# Patient Record
Sex: Female | Born: 1955 | Race: White | Hispanic: No | Marital: Married | Smoking: Former smoker
Health system: Southern US, Community
[De-identification: ages and names within clinical notes are randomized; demographics above are authoritative.]

## PROBLEM LIST (undated history)

## (undated) DIAGNOSIS — M1990 Unspecified osteoarthritis, unspecified site: Secondary | ICD-10-CM

## (undated) DIAGNOSIS — F32A Depression, unspecified: Secondary | ICD-10-CM

## (undated) DIAGNOSIS — R011 Cardiac murmur, unspecified: Secondary | ICD-10-CM

## (undated) DIAGNOSIS — C801 Malignant (primary) neoplasm, unspecified: Secondary | ICD-10-CM

## (undated) DIAGNOSIS — S22089A Unspecified fracture of T11-T12 vertebra, initial encounter for closed fracture: Secondary | ICD-10-CM

## (undated) DIAGNOSIS — G20 Parkinson's disease: Secondary | ICD-10-CM

## (undated) DIAGNOSIS — S32019A Unspecified fracture of first lumbar vertebra, initial encounter for closed fracture: Secondary | ICD-10-CM

## (undated) DIAGNOSIS — N390 Urinary tract infection, site not specified: Secondary | ICD-10-CM

## (undated) DIAGNOSIS — Z9889 Other specified postprocedural states: Secondary | ICD-10-CM

## (undated) DIAGNOSIS — F329 Major depressive disorder, single episode, unspecified: Secondary | ICD-10-CM

## (undated) DIAGNOSIS — K759 Inflammatory liver disease, unspecified: Secondary | ICD-10-CM

## (undated) DIAGNOSIS — E785 Hyperlipidemia, unspecified: Secondary | ICD-10-CM

## (undated) DIAGNOSIS — R251 Tremor, unspecified: Secondary | ICD-10-CM

## (undated) HISTORY — PX: CHOLECYSTECTOMY: SHX55

## (undated) HISTORY — DX: Malignant (primary) neoplasm, unspecified: C80.1

## (undated) HISTORY — DX: Parkinson's disease: G20

## (undated) HISTORY — DX: Major depressive disorder, single episode, unspecified: F32.9

## (undated) HISTORY — PX: TUBAL LIGATION: SHX77

## (undated) HISTORY — PX: ABDOMINAL HYSTERECTOMY: SHX81

## (undated) HISTORY — DX: Cardiac murmur, unspecified: R01.1

## (undated) HISTORY — PX: CERVICAL SPINE SURGERY: SHX589

## (undated) HISTORY — DX: Depression, unspecified: F32.A

---

## 1993-10-29 DIAGNOSIS — C519 Malignant neoplasm of vulva, unspecified: Secondary | ICD-10-CM

## 1993-10-29 HISTORY — DX: Malignant neoplasm of vulva, unspecified: C51.9

## 2004-08-24 ENCOUNTER — Ambulatory Visit: Payer: Self-pay | Admitting: Pain Medicine

## 2004-09-26 ENCOUNTER — Ambulatory Visit: Payer: Self-pay | Admitting: Pain Medicine

## 2004-10-17 ENCOUNTER — Ambulatory Visit: Payer: Self-pay | Admitting: Pain Medicine

## 2004-10-26 ENCOUNTER — Ambulatory Visit: Payer: Self-pay | Admitting: Unknown Physician Specialty

## 2004-11-16 ENCOUNTER — Ambulatory Visit: Payer: Self-pay | Admitting: Pain Medicine

## 2004-12-19 ENCOUNTER — Ambulatory Visit: Payer: Self-pay | Admitting: Pain Medicine

## 2005-01-23 ENCOUNTER — Ambulatory Visit: Payer: Self-pay | Admitting: Pain Medicine

## 2005-02-22 ENCOUNTER — Ambulatory Visit: Payer: Self-pay | Admitting: Pain Medicine

## 2005-03-22 ENCOUNTER — Ambulatory Visit: Payer: Self-pay | Admitting: Pain Medicine

## 2005-04-19 ENCOUNTER — Ambulatory Visit: Payer: Self-pay | Admitting: Pain Medicine

## 2005-05-22 ENCOUNTER — Ambulatory Visit: Payer: Self-pay | Admitting: Pain Medicine

## 2005-06-19 ENCOUNTER — Ambulatory Visit: Payer: Self-pay | Admitting: Pain Medicine

## 2005-07-19 ENCOUNTER — Ambulatory Visit: Payer: Self-pay | Admitting: Pain Medicine

## 2005-08-16 ENCOUNTER — Ambulatory Visit: Payer: Self-pay | Admitting: Pain Medicine

## 2005-09-12 ENCOUNTER — Ambulatory Visit: Payer: Self-pay | Admitting: Pain Medicine

## 2005-10-05 ENCOUNTER — Ambulatory Visit: Payer: Self-pay | Admitting: Unknown Physician Specialty

## 2005-10-16 ENCOUNTER — Ambulatory Visit: Payer: Self-pay | Admitting: Pain Medicine

## 2005-11-13 ENCOUNTER — Ambulatory Visit: Payer: Self-pay | Admitting: Pain Medicine

## 2005-12-13 ENCOUNTER — Ambulatory Visit: Payer: Self-pay | Admitting: Pain Medicine

## 2006-01-03 ENCOUNTER — Ambulatory Visit: Payer: Self-pay | Admitting: Pain Medicine

## 2006-01-29 ENCOUNTER — Ambulatory Visit: Payer: Self-pay | Admitting: Pain Medicine

## 2006-02-28 ENCOUNTER — Ambulatory Visit: Payer: Self-pay | Admitting: Pain Medicine

## 2006-04-04 ENCOUNTER — Ambulatory Visit: Payer: Self-pay | Admitting: Pain Medicine

## 2006-05-14 ENCOUNTER — Ambulatory Visit: Payer: Self-pay | Admitting: Pain Medicine

## 2006-06-11 ENCOUNTER — Ambulatory Visit: Payer: Self-pay | Admitting: Pain Medicine

## 2006-07-11 ENCOUNTER — Ambulatory Visit: Payer: Self-pay | Admitting: Pain Medicine

## 2006-08-13 ENCOUNTER — Ambulatory Visit: Payer: Self-pay | Admitting: Pain Medicine

## 2006-08-29 ENCOUNTER — Ambulatory Visit: Payer: Self-pay | Admitting: Pain Medicine

## 2006-10-10 ENCOUNTER — Ambulatory Visit: Payer: Self-pay | Admitting: Pain Medicine

## 2006-11-12 ENCOUNTER — Ambulatory Visit: Payer: Self-pay | Admitting: Pain Medicine

## 2006-12-10 ENCOUNTER — Ambulatory Visit: Payer: Self-pay | Admitting: Pain Medicine

## 2007-01-08 ENCOUNTER — Ambulatory Visit: Payer: Self-pay | Admitting: Pain Medicine

## 2007-02-06 ENCOUNTER — Ambulatory Visit: Payer: Self-pay | Admitting: Pain Medicine

## 2007-03-11 ENCOUNTER — Ambulatory Visit: Payer: Self-pay | Admitting: Pain Medicine

## 2007-04-08 ENCOUNTER — Ambulatory Visit: Payer: Self-pay | Admitting: Pain Medicine

## 2007-05-06 ENCOUNTER — Ambulatory Visit: Payer: Self-pay | Admitting: Pain Medicine

## 2007-06-05 ENCOUNTER — Ambulatory Visit: Payer: Self-pay | Admitting: Pain Medicine

## 2007-06-23 ENCOUNTER — Ambulatory Visit: Payer: Self-pay | Admitting: Pain Medicine

## 2007-08-08 ENCOUNTER — Ambulatory Visit: Payer: Self-pay | Admitting: Pain Medicine

## 2007-09-09 ENCOUNTER — Ambulatory Visit: Payer: Self-pay | Admitting: Pain Medicine

## 2007-10-07 ENCOUNTER — Ambulatory Visit: Payer: Self-pay | Admitting: Pain Medicine

## 2007-11-10 ENCOUNTER — Ambulatory Visit: Payer: Self-pay | Admitting: Pain Medicine

## 2007-12-03 ENCOUNTER — Ambulatory Visit: Payer: Self-pay | Admitting: Pain Medicine

## 2008-01-06 ENCOUNTER — Ambulatory Visit: Payer: Self-pay | Admitting: Pain Medicine

## 2008-02-03 ENCOUNTER — Ambulatory Visit: Payer: Self-pay | Admitting: Pain Medicine

## 2008-03-09 ENCOUNTER — Ambulatory Visit: Payer: Self-pay | Admitting: Pain Medicine

## 2008-04-08 ENCOUNTER — Ambulatory Visit: Payer: Self-pay | Admitting: Pain Medicine

## 2008-05-06 ENCOUNTER — Ambulatory Visit: Payer: Self-pay | Admitting: Pain Medicine

## 2008-06-08 ENCOUNTER — Ambulatory Visit: Payer: Self-pay | Admitting: Pain Medicine

## 2008-07-08 ENCOUNTER — Ambulatory Visit: Payer: Self-pay | Admitting: Pain Medicine

## 2008-08-10 ENCOUNTER — Ambulatory Visit: Payer: Self-pay | Admitting: Pain Medicine

## 2008-09-07 ENCOUNTER — Ambulatory Visit: Payer: Self-pay | Admitting: Pain Medicine

## 2008-10-07 ENCOUNTER — Ambulatory Visit: Payer: Self-pay | Admitting: Pain Medicine

## 2008-11-04 ENCOUNTER — Ambulatory Visit: Payer: Self-pay | Admitting: Pain Medicine

## 2008-12-02 ENCOUNTER — Ambulatory Visit: Payer: Self-pay | Admitting: Pain Medicine

## 2008-12-29 ENCOUNTER — Ambulatory Visit: Payer: Self-pay | Admitting: Pain Medicine

## 2009-01-27 ENCOUNTER — Ambulatory Visit: Payer: Self-pay | Admitting: Pain Medicine

## 2009-03-01 ENCOUNTER — Ambulatory Visit: Payer: Self-pay | Admitting: Pain Medicine

## 2009-03-31 ENCOUNTER — Ambulatory Visit: Payer: Self-pay | Admitting: Pain Medicine

## 2009-04-28 ENCOUNTER — Ambulatory Visit: Payer: Self-pay | Admitting: Pain Medicine

## 2009-05-09 ENCOUNTER — Emergency Department: Payer: Self-pay | Admitting: Emergency Medicine

## 2009-05-11 ENCOUNTER — Emergency Department: Payer: Self-pay | Admitting: Emergency Medicine

## 2009-05-31 ENCOUNTER — Ambulatory Visit: Payer: Self-pay | Admitting: Pain Medicine

## 2009-06-30 ENCOUNTER — Ambulatory Visit: Payer: Self-pay | Admitting: Pain Medicine

## 2009-07-28 ENCOUNTER — Ambulatory Visit: Payer: Self-pay | Admitting: Pain Medicine

## 2009-08-30 ENCOUNTER — Ambulatory Visit: Payer: Self-pay | Admitting: Pain Medicine

## 2009-09-29 ENCOUNTER — Ambulatory Visit: Payer: Self-pay | Admitting: Pain Medicine

## 2009-10-25 ENCOUNTER — Ambulatory Visit: Payer: Self-pay | Admitting: Pain Medicine

## 2009-11-24 ENCOUNTER — Ambulatory Visit: Payer: Self-pay | Admitting: Pain Medicine

## 2009-12-27 ENCOUNTER — Ambulatory Visit: Payer: Self-pay | Admitting: Pain Medicine

## 2010-01-24 ENCOUNTER — Ambulatory Visit: Payer: Self-pay | Admitting: Pain Medicine

## 2010-02-21 ENCOUNTER — Ambulatory Visit: Payer: Self-pay | Admitting: Pain Medicine

## 2010-03-23 ENCOUNTER — Ambulatory Visit: Payer: Self-pay | Admitting: Pain Medicine

## 2010-04-25 ENCOUNTER — Ambulatory Visit: Payer: Self-pay | Admitting: Pain Medicine

## 2010-05-25 ENCOUNTER — Ambulatory Visit: Payer: Self-pay | Admitting: Pain Medicine

## 2010-06-22 ENCOUNTER — Ambulatory Visit: Payer: Self-pay | Admitting: Pain Medicine

## 2010-07-27 ENCOUNTER — Ambulatory Visit: Payer: Self-pay | Admitting: Pain Medicine

## 2010-08-24 ENCOUNTER — Ambulatory Visit: Payer: Self-pay | Admitting: Pain Medicine

## 2010-09-26 ENCOUNTER — Ambulatory Visit: Payer: Self-pay | Admitting: Pain Medicine

## 2010-10-26 ENCOUNTER — Ambulatory Visit: Payer: Self-pay | Admitting: Pain Medicine

## 2010-11-23 ENCOUNTER — Ambulatory Visit: Payer: Self-pay | Admitting: Pain Medicine

## 2010-12-21 ENCOUNTER — Ambulatory Visit: Payer: Self-pay | Admitting: Pain Medicine

## 2011-01-23 ENCOUNTER — Ambulatory Visit: Payer: Self-pay | Admitting: Pain Medicine

## 2011-02-22 ENCOUNTER — Ambulatory Visit: Payer: Self-pay | Admitting: Pain Medicine

## 2011-03-22 ENCOUNTER — Ambulatory Visit: Payer: Self-pay | Admitting: Pain Medicine

## 2011-04-19 ENCOUNTER — Ambulatory Visit: Payer: Self-pay | Admitting: Pain Medicine

## 2011-05-22 ENCOUNTER — Ambulatory Visit: Payer: Self-pay | Admitting: Pain Medicine

## 2011-06-21 ENCOUNTER — Ambulatory Visit: Payer: Self-pay | Admitting: Pain Medicine

## 2011-07-24 ENCOUNTER — Ambulatory Visit: Payer: Self-pay | Admitting: Pain Medicine

## 2011-08-23 ENCOUNTER — Ambulatory Visit: Payer: Self-pay | Admitting: Pain Medicine

## 2011-09-18 ENCOUNTER — Ambulatory Visit: Payer: Self-pay | Admitting: Pain Medicine

## 2011-10-11 ENCOUNTER — Ambulatory Visit: Payer: Self-pay | Admitting: Pain Medicine

## 2011-11-20 ENCOUNTER — Ambulatory Visit: Payer: Self-pay | Admitting: Pain Medicine

## 2011-12-17 ENCOUNTER — Ambulatory Visit: Payer: Self-pay | Admitting: Pain Medicine

## 2012-01-16 ENCOUNTER — Ambulatory Visit: Payer: Self-pay | Admitting: Pain Medicine

## 2012-02-13 ENCOUNTER — Ambulatory Visit: Payer: Self-pay | Admitting: Pain Medicine

## 2012-03-19 ENCOUNTER — Ambulatory Visit: Payer: Self-pay | Admitting: Pain Medicine

## 2012-04-16 ENCOUNTER — Ambulatory Visit: Payer: Self-pay | Admitting: Pain Medicine

## 2012-05-14 ENCOUNTER — Ambulatory Visit: Payer: Self-pay | Admitting: Pain Medicine

## 2012-06-12 ENCOUNTER — Ambulatory Visit: Payer: Self-pay | Admitting: Pain Medicine

## 2012-07-09 ENCOUNTER — Ambulatory Visit: Payer: Self-pay | Admitting: Pain Medicine

## 2012-08-13 ENCOUNTER — Ambulatory Visit: Payer: Self-pay | Admitting: Pain Medicine

## 2012-09-10 ENCOUNTER — Ambulatory Visit: Payer: Self-pay | Admitting: Pain Medicine

## 2012-10-08 ENCOUNTER — Ambulatory Visit: Payer: Self-pay | Admitting: Pain Medicine

## 2012-10-13 ENCOUNTER — Ambulatory Visit: Payer: Self-pay | Admitting: Pain Medicine

## 2012-11-10 ENCOUNTER — Ambulatory Visit: Payer: Self-pay | Admitting: Pain Medicine

## 2012-12-09 ENCOUNTER — Ambulatory Visit: Payer: Self-pay | Admitting: Pain Medicine

## 2013-01-07 ENCOUNTER — Ambulatory Visit: Payer: Self-pay | Admitting: Pain Medicine

## 2013-02-09 ENCOUNTER — Ambulatory Visit: Payer: Self-pay | Admitting: Pain Medicine

## 2013-03-11 ENCOUNTER — Ambulatory Visit: Payer: Self-pay | Admitting: Pain Medicine

## 2013-04-08 ENCOUNTER — Ambulatory Visit: Payer: Self-pay | Admitting: Pain Medicine

## 2013-05-06 ENCOUNTER — Ambulatory Visit: Payer: Self-pay | Admitting: Pain Medicine

## 2013-06-03 ENCOUNTER — Ambulatory Visit: Payer: Self-pay | Admitting: Pain Medicine

## 2013-07-01 ENCOUNTER — Ambulatory Visit: Payer: Self-pay | Admitting: Pain Medicine

## 2013-07-29 ENCOUNTER — Ambulatory Visit: Payer: Self-pay | Admitting: Pain Medicine

## 2013-09-02 ENCOUNTER — Ambulatory Visit: Payer: Self-pay | Admitting: Pain Medicine

## 2013-10-08 ENCOUNTER — Ambulatory Visit: Payer: Self-pay | Admitting: Pain Medicine

## 2013-11-02 ENCOUNTER — Ambulatory Visit: Payer: Self-pay | Admitting: Pain Medicine

## 2013-12-03 ENCOUNTER — Ambulatory Visit: Payer: Self-pay | Admitting: Pain Medicine

## 2013-12-30 ENCOUNTER — Ambulatory Visit: Payer: Self-pay | Admitting: Pain Medicine

## 2014-01-27 ENCOUNTER — Ambulatory Visit: Payer: Self-pay | Admitting: Pain Medicine

## 2014-02-25 ENCOUNTER — Ambulatory Visit: Payer: Self-pay | Admitting: Pain Medicine

## 2014-04-01 ENCOUNTER — Ambulatory Visit: Payer: Self-pay | Admitting: Pain Medicine

## 2014-05-03 ENCOUNTER — Ambulatory Visit: Payer: Self-pay | Admitting: Pain Medicine

## 2014-06-03 ENCOUNTER — Ambulatory Visit: Payer: Self-pay | Admitting: Pain Medicine

## 2014-07-01 ENCOUNTER — Ambulatory Visit: Payer: Self-pay | Admitting: Pain Medicine

## 2014-07-29 ENCOUNTER — Ambulatory Visit: Payer: Self-pay | Admitting: Pain Medicine

## 2014-09-02 ENCOUNTER — Ambulatory Visit: Payer: Self-pay | Admitting: Pain Medicine

## 2014-09-30 ENCOUNTER — Ambulatory Visit: Payer: Self-pay | Admitting: Pain Medicine

## 2014-11-04 ENCOUNTER — Ambulatory Visit: Payer: Self-pay | Admitting: Pain Medicine

## 2014-12-09 ENCOUNTER — Ambulatory Visit: Payer: Self-pay | Admitting: Pain Medicine

## 2015-01-06 ENCOUNTER — Ambulatory Visit: Payer: Self-pay | Admitting: Pain Medicine

## 2015-02-03 ENCOUNTER — Ambulatory Visit
Admit: 2015-02-03 | Disposition: A | Discharge status: Home / Self Care | Payer: Self-pay | Attending: Pain Medicine | Admitting: Pain Medicine

## 2015-03-08 ENCOUNTER — Encounter: Payer: Self-pay | Admitting: Pain Medicine

## 2015-03-10 ENCOUNTER — Ambulatory Visit: Payer: 59 | Attending: Pain Medicine | Admitting: Pain Medicine

## 2015-03-10 ENCOUNTER — Encounter: Payer: Self-pay | Admitting: Pain Medicine

## 2015-03-10 VITALS — BP 127/75 | HR 79 | Temp 98.3°F | Resp 18 | Ht 60.0 in | Wt 155.0 lb

## 2015-03-10 DIAGNOSIS — M25511 Pain in right shoulder: Secondary | ICD-10-CM | POA: Diagnosis present

## 2015-03-10 DIAGNOSIS — M545 Low back pain: Secondary | ICD-10-CM | POA: Diagnosis present

## 2015-03-10 DIAGNOSIS — M19019 Primary osteoarthritis, unspecified shoulder: Secondary | ICD-10-CM | POA: Diagnosis not present

## 2015-03-10 DIAGNOSIS — M25512 Pain in left shoulder: Secondary | ICD-10-CM | POA: Diagnosis present

## 2015-03-10 DIAGNOSIS — M5136 Other intervertebral disc degeneration, lumbar region: Secondary | ICD-10-CM | POA: Diagnosis not present

## 2015-03-10 DIAGNOSIS — M19011 Primary osteoarthritis, right shoulder: Secondary | ICD-10-CM

## 2015-03-10 DIAGNOSIS — M7060 Trochanteric bursitis, unspecified hip: Secondary | ICD-10-CM | POA: Insufficient documentation

## 2015-03-10 DIAGNOSIS — M47818 Spondylosis without myelopathy or radiculopathy, sacral and sacrococcygeal region: Secondary | ICD-10-CM

## 2015-03-10 DIAGNOSIS — M7062 Trochanteric bursitis, left hip: Secondary | ICD-10-CM

## 2015-03-10 DIAGNOSIS — M19012 Primary osteoarthritis, left shoulder: Secondary | ICD-10-CM

## 2015-03-10 DIAGNOSIS — M7061 Trochanteric bursitis, right hip: Secondary | ICD-10-CM

## 2015-03-10 DIAGNOSIS — M1990 Unspecified osteoarthritis, unspecified site: Secondary | ICD-10-CM | POA: Diagnosis not present

## 2015-03-10 DIAGNOSIS — M461 Sacroiliitis, not elsewhere classified: Secondary | ICD-10-CM

## 2015-03-10 MED ORDER — FENTANYL 25 MCG/HR TD PT72: 25.0000 ug | MEDICATED_PATCH | TRANSDERMAL | Status: DC

## 2015-03-10 NOTE — Progress Notes (Signed)
Discharge patient home; ambulatory at  1219 hrs.  Script of fentanyl 25 mcg given

## 2015-03-10 NOTE — Progress Notes (Signed)
   Subjective:    Patient ID: Jordan Duran, female    DOB: 08/27/1956, 59 y.o.   MRN: [id number]  HPI    Review of Systems     Objective:   Physical Exam        Assessment & Plan:

## 2015-03-10 NOTE — Progress Notes (Signed)
Patient is a 59 year old female returns to pain management Center for further evaluation and treatment of pain involving the lower back region and shoulders with pain well-controlled at this time. Patient did admit to pain involving the hands and will undergo rheumatological evaluation and follow-up with Dr. Earley Abide  as discussed. We may modify medications and treatment pending rheumatological evaluation is discussed with patient.  Physical examination  Tennis palpation of the splenius capitis was noted to be mild. Assistance of the acromioclavicular and glenohumeral joint regions of moderate degree outpatient of the cervical and thoracic paraspinal musculature region reproduced mild to moderate discomfort. Patient with decreased grip strength bilaterally with nodules of the digits of the hands noted that increased warmth or erythema is significantly decreased grip strength and with pain. Range of motion maneuvers of the digits of the hand. Palpation of the lumbar paraspinal muscular musculatures and lumbar facet region was noted to be mild tenderness of the PSIS tinnitus of the greater trochanteric regions were noted raising tolerated to 30 without increased pain with dorsiflexion. Clonus negative Homans abdomen soft nontender no costovertebral angle tenderness noted  Assessment  Degenerative disc disease lumbar spine  Degenerative joint disease shoulder  Trochanteric bursitis  Osteoarthritis Evaluate for rheumatoid arthritis and the underlying rheumatological condition  Plan  Continue fentanyl patch Patient without need for hydrocodone and Ambien at this time  Follow-up Dr.Ingledue for evaluation blood pressure in general medical condition and to discuss rheumatological evaluation patient with significant pain and decreased range of motion of the hands especially  May consider interventional treatment pending further evaluation  Patient to call pain management prior to scheduled return,  should there be change in condition of significant degree

## 2015-03-10 NOTE — Progress Notes (Signed)
   Subjective:    Patient ID: Jordan Duran, female    DOB: 10/21/1956, 59 y.o.   MRN: [id number]  HPI    Review of Systems     Objective:   Physical Exam        Assessment & Plan:

## 2015-03-10 NOTE — Patient Instructions (Addendum)
Continue present medications. Fentanyl patch  F/U PCP for evaliation of  BP and general medical  condition. And see rheumatologist after discussed with Dr. Jessie Foot  F/U surgical evaluation.  F/U nrurological evaluation.  May consider radiofrequency rhizolysis or intraspinal procedures pending response to present treatment and F/U evaluation.  Patient to call Pain Management Center should patient have concerns prior to scheduled return appointment.

## 2015-04-01 ENCOUNTER — Other Ambulatory Visit: Payer: Self-pay | Admitting: Pain Medicine

## 2015-04-01 DIAGNOSIS — M19011 Primary osteoarthritis, right shoulder: Secondary | ICD-10-CM

## 2015-04-01 DIAGNOSIS — M461 Sacroiliitis, not elsewhere classified: Secondary | ICD-10-CM

## 2015-04-01 DIAGNOSIS — M7062 Trochanteric bursitis, left hip: Secondary | ICD-10-CM

## 2015-04-01 DIAGNOSIS — M5136 Other intervertebral disc degeneration, lumbar region: Secondary | ICD-10-CM

## 2015-04-01 DIAGNOSIS — M47818 Spondylosis without myelopathy or radiculopathy, sacral and sacrococcygeal region: Secondary | ICD-10-CM

## 2015-04-01 DIAGNOSIS — M7061 Trochanteric bursitis, right hip: Secondary | ICD-10-CM

## 2015-04-04 ENCOUNTER — Encounter: Payer: Self-pay | Admitting: Pain Medicine

## 2015-04-04 ENCOUNTER — Ambulatory Visit: Payer: 59 | Attending: Pain Medicine | Admitting: Pain Medicine

## 2015-04-04 VITALS — BP 131/74 | HR 93 | Temp 99.5°F | Resp 16 | Ht 60.0 in | Wt 155.0 lb

## 2015-04-04 DIAGNOSIS — M7060 Trochanteric bursitis, unspecified hip: Secondary | ICD-10-CM | POA: Insufficient documentation

## 2015-04-04 DIAGNOSIS — M7061 Trochanteric bursitis, right hip: Secondary | ICD-10-CM

## 2015-04-04 DIAGNOSIS — M79604 Pain in right leg: Secondary | ICD-10-CM | POA: Diagnosis present

## 2015-04-04 DIAGNOSIS — M461 Sacroiliitis, not elsewhere classified: Secondary | ICD-10-CM

## 2015-04-04 DIAGNOSIS — M1990 Unspecified osteoarthritis, unspecified site: Secondary | ICD-10-CM | POA: Insufficient documentation

## 2015-04-04 DIAGNOSIS — M533 Sacrococcygeal disorders, not elsewhere classified: Secondary | ICD-10-CM | POA: Insufficient documentation

## 2015-04-04 DIAGNOSIS — M79605 Pain in left leg: Secondary | ICD-10-CM | POA: Diagnosis present

## 2015-04-04 DIAGNOSIS — M19011 Primary osteoarthritis, right shoulder: Secondary | ICD-10-CM

## 2015-04-04 DIAGNOSIS — M549 Dorsalgia, unspecified: Secondary | ICD-10-CM | POA: Diagnosis present

## 2015-04-04 DIAGNOSIS — M47818 Spondylosis without myelopathy or radiculopathy, sacral and sacrococcygeal region: Secondary | ICD-10-CM

## 2015-04-04 DIAGNOSIS — M5136 Other intervertebral disc degeneration, lumbar region: Secondary | ICD-10-CM | POA: Diagnosis not present

## 2015-04-04 DIAGNOSIS — M19012 Primary osteoarthritis, left shoulder: Secondary | ICD-10-CM

## 2015-04-04 DIAGNOSIS — M7062 Trochanteric bursitis, left hip: Secondary | ICD-10-CM

## 2015-04-04 MED ORDER — FENTANYL 25 MCG/HR TD PT72: MEDICATED_PATCH | TRANSDERMAL | Status: DC

## 2015-04-04 NOTE — Progress Notes (Signed)
Safety precautions to be maintained throughout the outpatient stay will include: orient to surroundings, keep bed in low position, maintain call bell within reach at all times, provide assistance with transfer out of bed and ambulation.  

## 2015-04-04 NOTE — Progress Notes (Signed)
   Subjective:    Patient ID: Jordan Duran, female    DOB: 1956/09/22, 59 y.o.   MRN: [id number]  HPI  Patient is 59 year old female returns to [facility] for further evaluation and treatment of pain involving the back and lower extremity regions with history of pain involving the shoulders as well days visit we discussed patient's overall condition including pain of the hands and nodules of the phalanges. Patient is to undergo follow-up evaluation with primary care physician and consider rheumatological evaluation as discussed with patient on today's visit patient will further address rheumatological evaluation with Dr. Irwin Brakeman and we will continue medications as prescribed this time. Patient was understanding and agreement with suggested treatment plan      Review of Systems     Objective:   Physical Exam There was tenderness of spleen scapula and occipitalis musculature regions of mild degree with mild tinnitus of the acromioclavicular and glenohumeral joint regions as well. Tinel and Phalen's maneuver associated with mild to moderate discomfort patient with nodules of the distal interphalangeal joints without increased warmth and erythema of the digits or hands noted. There was decreased grip strength as well. Palpation over the thoracic paraspinal musculature region thoracic facet region associated with mild discomfort with no crepitus of the thoracic region noted. Palpation over the lumbar paraspinal muscle lumbar facet region associated with mild to moderate discomfort with lateral bending and rotation reproducing mild to moderate discomfort. His over the PSIS and PII S region reproduced mild discomfort and there was mild to tenderness to palpation of the greater trochanteric region and iliotibial band regions. Straight leg raising tolerates approximately 30 no increased pain with dorsiflexion noted there was negative clonus negative Homans. No sensory deficit of  dermatomal distribution detected. Abdomen was nontender and no costovertebral angle tenderness noted.       Assessment & Plan:    Degenerative disc disease lumbar spine   L4-L5 level involving predominantly  Lumbar facet syndrome  Sacroiliac joint dysfunction  Greater trochanteric bursitis  Arthritis(. Evaluate for underlying rheumatological and collagen vascular disorders)        Plan   Continue present medications.  F/U PCP for evaliation of  BP and general medical  Condition.  Rheumatological evaluation as discussed. Patient is to address rheumatological evaluation with Dr. Irwin Brakeman  F/U surgical evaluation.  F/U neurological evaluation.  May consider radiofrequency rhizolysis or intraspinal procedures pending response to present treatment and F/U evaluation.  Patient to call Pain Management Center should patient have concerns prior to scheduled return appointment.

## 2015-04-04 NOTE — Patient Instructions (Signed)
Continue present medications.  F/U PCP for evaliation of  BP and general medical  condition.  F/U surgical evaluation.  F/U neurological evaluation.  May consider radiofrequency rhizolysis or intraspinal procedures pending response to present treatment and F/U evaluation.  Patient to call Pain Management Center should patient have concerns prior to scheduled return appointment.  

## 2015-04-04 NOTE — Progress Notes (Signed)
Discharged at 1215, ambulatory

## 2015-04-14 ENCOUNTER — Other Ambulatory Visit: Payer: Self-pay | Admitting: Pain Medicine

## 2015-05-04 ENCOUNTER — Other Ambulatory Visit: Payer: Self-pay | Admitting: Pain Medicine

## 2015-05-04 ENCOUNTER — Ambulatory Visit: Payer: 59 | Attending: Pain Medicine | Admitting: Pain Medicine

## 2015-05-04 ENCOUNTER — Encounter: Payer: Self-pay | Admitting: Pain Medicine

## 2015-05-04 VITALS — BP 105/71 | HR 83 | Temp 96.7°F | Resp 16 | Ht 60.0 in | Wt 150.0 lb

## 2015-05-04 DIAGNOSIS — M5136 Other intervertebral disc degeneration, lumbar region: Secondary | ICD-10-CM | POA: Diagnosis not present

## 2015-05-04 DIAGNOSIS — M13841 Other specified arthritis, right hand: Secondary | ICD-10-CM | POA: Diagnosis not present

## 2015-05-04 DIAGNOSIS — M7060 Trochanteric bursitis, unspecified hip: Secondary | ICD-10-CM | POA: Insufficient documentation

## 2015-05-04 DIAGNOSIS — M533 Sacrococcygeal disorders, not elsewhere classified: Secondary | ICD-10-CM | POA: Diagnosis not present

## 2015-05-04 DIAGNOSIS — M79605 Pain in left leg: Secondary | ICD-10-CM | POA: Diagnosis present

## 2015-05-04 DIAGNOSIS — M461 Sacroiliitis, not elsewhere classified: Secondary | ICD-10-CM

## 2015-05-04 DIAGNOSIS — M13842 Other specified arthritis, left hand: Secondary | ICD-10-CM | POA: Diagnosis not present

## 2015-05-04 DIAGNOSIS — M7062 Trochanteric bursitis, left hip: Secondary | ICD-10-CM

## 2015-05-04 DIAGNOSIS — M19019 Primary osteoarthritis, unspecified shoulder: Secondary | ICD-10-CM | POA: Diagnosis not present

## 2015-05-04 DIAGNOSIS — M19011 Primary osteoarthritis, right shoulder: Secondary | ICD-10-CM

## 2015-05-04 DIAGNOSIS — M545 Low back pain: Secondary | ICD-10-CM | POA: Diagnosis present

## 2015-05-04 DIAGNOSIS — M19012 Primary osteoarthritis, left shoulder: Secondary | ICD-10-CM

## 2015-05-04 DIAGNOSIS — M47818 Spondylosis without myelopathy or radiculopathy, sacral and sacrococcygeal region: Secondary | ICD-10-CM

## 2015-05-04 DIAGNOSIS — M79604 Pain in right leg: Secondary | ICD-10-CM | POA: Diagnosis present

## 2015-05-04 DIAGNOSIS — M51369 Other intervertebral disc degeneration, lumbar region without mention of lumbar back pain or lower extremity pain: Secondary | ICD-10-CM

## 2015-05-04 DIAGNOSIS — M7061 Trochanteric bursitis, right hip: Secondary | ICD-10-CM

## 2015-05-04 MED ORDER — HYDROCODONE-ACETAMINOPHEN 7.5-325 MG PO TABS: 1.0000 | ORAL_TABLET | ORAL | Status: DC | PRN

## 2015-05-04 MED ORDER — FENTANYL 25 MCG/HR TD PT72: MEDICATED_PATCH | TRANSDERMAL | Status: DC

## 2015-05-04 NOTE — Progress Notes (Signed)
   Subjective:    Patient ID: Jordan Duran, female    DOB: Jun 08, 1956, 59 y.o.   MRN: [id number]  HPI  Patient 59 year old female returns to [facility] for further evaluation and treatment of pain involving the region of the lower back and lower extremity region predominantly The patient states that the pain present time appears to be fairly well-controlled. Patient denies any trauma change in events of daily living the call significant change in symptoms pathology. We discussed patient's condition will continue fentanyl patch and hydrocodone acetaminophen and remain available to consider modification of treatment regimen should there be significant change of patient's condition. Patient will undergo rheumatological evaluation as discussed with her primary care physician [place] and we will await results of the rheumatological evaluation as discussed. The patient is understanding and agrees with suggested treatment plan     Review of Systems     Objective:   Physical Exam  There was tenderness over the splenius capitis and occipitalis musculature region of mild degree. There was mild tinnitus of the acromioclavicular glenohumeral joint regions. Patient appeared to be with unremarkable Spurling's maneuver. Palpation of the cervical and thoracic paraspinal muscular region was with mild to moderate discomfort Tinel and Phalen's maneuver was without significant increased pain. Nodules of the digits of the hand were noted especially the distal interphalangeal joints without increased warmth or erythema in the region of the extremities. No crepitus of the thoracic region was noted. Palpation over the lumbar paraspinal musculature region was a tends to palpation of mild degree. Lateral bending rotation and extension and palpation of the lumbar facets reproduce mild discomfort. There was negative clonus negative Homans. Palpation over the PSIS and PII S region reproduced mild to moderate  discomfort. With mild tinnitus of the greater trochanteric region iliotibial band region. Straight leg raising tolerated to 30 without increased pain with dorsiflexion noted. Negative clonus negative Homans. Abdomen nontender with no costovertebral angle tenderness noted.      Assessment & Plan:  Degenerative disc disease lumbar spine Lumbar facet syndrome  Greater trochanteric bursitis  Sacroiliac joint dysfunction  Degenerative joint disease of shoulder  Arthritic changes of joints of hands    Plan Continue present medications fentanyl patch and hydrocodone acetaminophen  F/U PCP for evaliation of  BP and general medical  Condition  Rheumatological evaluation as scheduled.  F/U surgical evaluation  F/U neurological evaluation  May consider radiofrequency rhizolysis or intraspinal procedures pending response to present treatment and F/U evaluation.  Patient to call [facility] Management Center should patient have concerns prior to scheduled return appointment.

## 2015-05-04 NOTE — Progress Notes (Signed)
Discharge instructions given. Ambulatory.  Teach back 3 done.  Script in hand for Duragesic and hydrocodone.  Return in 1 month

## 2015-05-04 NOTE — Patient Instructions (Addendum)
Continue present medications fentanyl patch and hydrocodone acetaminophen    F/U PCP Dr Jessie Foot for evaliation of  BP and general medical  condition.  F/U surgical evaluation  F/U neurological evaluation  F/U rheumatological evaluation as planned and is scheduled  May consider radiofrequency rhizolysis or intraspinal procedures pending response to present treatment and F/U evaluation.  Patient to call Pain Management Center should patient have concerns prior to scheduled return appointment.

## 2015-05-04 NOTE — Progress Notes (Signed)
Safety precautions to be maintained throughout the outpatient stay will include: orient to surroundings, keep bed in low position, maintain call bell within reach at all times, provide assistance with transfer out of bed and ambulation.  

## 2015-05-05 ENCOUNTER — Encounter: Payer: 59 | Admitting: Pain Medicine

## 2015-06-02 ENCOUNTER — Encounter: Payer: Self-pay | Admitting: Pain Medicine

## 2015-06-02 ENCOUNTER — Ambulatory Visit: Payer: PRIVATE HEALTH INSURANCE | Attending: Pain Medicine | Admitting: Pain Medicine

## 2015-06-02 VITALS — BP 132/70 | HR 88 | Temp 98.1°F | Resp 16 | Ht 60.0 in | Wt 150.0 lb

## 2015-06-02 DIAGNOSIS — M19019 Primary osteoarthritis, unspecified shoulder: Secondary | ICD-10-CM | POA: Insufficient documentation

## 2015-06-02 DIAGNOSIS — M7062 Trochanteric bursitis, left hip: Secondary | ICD-10-CM

## 2015-06-02 DIAGNOSIS — M7060 Trochanteric bursitis, unspecified hip: Secondary | ICD-10-CM | POA: Diagnosis not present

## 2015-06-02 DIAGNOSIS — M13842 Other specified arthritis, left hand: Secondary | ICD-10-CM | POA: Insufficient documentation

## 2015-06-02 DIAGNOSIS — M47818 Spondylosis without myelopathy or radiculopathy, sacral and sacrococcygeal region: Secondary | ICD-10-CM

## 2015-06-02 DIAGNOSIS — M5136 Other intervertebral disc degeneration, lumbar region: Secondary | ICD-10-CM | POA: Insufficient documentation

## 2015-06-02 DIAGNOSIS — M461 Sacroiliitis, not elsewhere classified: Secondary | ICD-10-CM

## 2015-06-02 DIAGNOSIS — M79605 Pain in left leg: Secondary | ICD-10-CM | POA: Diagnosis present

## 2015-06-02 DIAGNOSIS — M79604 Pain in right leg: Secondary | ICD-10-CM | POA: Diagnosis present

## 2015-06-02 DIAGNOSIS — M13841 Other specified arthritis, right hand: Secondary | ICD-10-CM | POA: Diagnosis not present

## 2015-06-02 DIAGNOSIS — M7061 Trochanteric bursitis, right hip: Secondary | ICD-10-CM

## 2015-06-02 DIAGNOSIS — M19011 Primary osteoarthritis, right shoulder: Secondary | ICD-10-CM

## 2015-06-02 DIAGNOSIS — M533 Sacrococcygeal disorders, not elsewhere classified: Secondary | ICD-10-CM | POA: Insufficient documentation

## 2015-06-02 DIAGNOSIS — M545 Low back pain: Secondary | ICD-10-CM | POA: Diagnosis present

## 2015-06-02 MED ORDER — FENTANYL 25 MCG/HR TD PT72: MEDICATED_PATCH | TRANSDERMAL | Status: DC

## 2015-06-02 NOTE — Patient Instructions (Addendum)
Continue present medication fentanyl patch and hydrocodone acetaminophen and Ambien  F/U PCP Dr. Vickki Muff for evaliation of  BP and general medical  condition  F/U surgical evaluation  F/U neurological evaluation  Rheumatological evaluation as planned  May consider radiofrequency rhizolysis or intraspinal procedures pending response to present treatment and F/U evaluation   Patient to call Pain Management Center should patient have concerns prior to scheduled return appointmen.  You were given a prescription for  Fentanyl patch today.

## 2015-06-02 NOTE — Progress Notes (Signed)
Safety precautions to be maintained throughout the outpatient stay will include: orient to surroundings, keep bed in low position, maintain call bell within reach at all times, provide assistance with transfer out of bed and ambulation.  

## 2015-06-02 NOTE — Progress Notes (Signed)
   Subjective:    Patient ID: Jordan Duran, female    DOB: 1956/08/13, 59 y.o.   MRN: [id number]  HPI  Patient is 59 year old female returns to pain management for further evaluation and treatment of pain involving the shoulder as well as lower back and lower extremity region. Patient was with pain well-controlled at this time. Patient denies trauma change in events of daily living the call change in symptomatology. We will continue patient's fentanyl patch with hydrocodone acetaminophen for breakthrough pain and patient will use Ambien as discussed as well. The patient will undergo further rheumatological evaluation as scheduled by Dr. Vickki Muff The patient was with understanding and agreement with suggested treatment plan   Review of Systems     Objective:   Physical Exam  There was mild tenderness please The occipitalis region mild tenderness of the acromial clavicular glenohumeral joint region patient was with unremarkable drop test and unremarkable Spurling's maneuver. Tinel and Phalen's maneuver without increased pain of significant degree. Patient was with bilaterally equal grip strength. Palpation over the cervical thoracic and lumbar regions were without increased pain of significant degree. There was minimal tenderness of the lumbar facet lumbar paraspinal musculature region as well as the gluteal and piriformis musculature regions. Minimal tenderness of the PSIS PII S region was noted tolerated to 30 without increased pain dorsiflexion noted with negative clonus negative Homans. Abdomen nontender and no costovertebral maintenance noted.     Assessment & Plan:     Degenerative disc disease lumbar spine Lumbar facet syndrome  Greater trochanteric bursitis  Sacroiliac joint dysfunction  Degenerative joint disease of shoulder  Arthritic changes of joints of hands   Plan   Continue present medication fentanyl patch hydrocodone acetaminophen and Ambien  F/U PCP Dr. Vickki Muff  for rheumatological reevaluation and for evaliation of  BP and general medical  condition  F/U surgical evaluation  F/U neurological evaluation  Rheumatological evaluation as planned  May consider radiofrequency rhizolysis or intraspinal procedures pending response to present treatment and F/U evaluation   Patient to call [facility] Management Center should patient have concerns prior to scheduled return appointmen.

## 2015-06-28 ENCOUNTER — Ambulatory Visit: Payer: PRIVATE HEALTH INSURANCE | Attending: Pain Medicine | Admitting: Pain Medicine

## 2015-06-28 ENCOUNTER — Encounter: Payer: Self-pay | Admitting: Pain Medicine

## 2015-06-28 VITALS — BP 126/76 | HR 85 | Temp 98.4°F | Resp 18 | Ht 60.0 in | Wt 155.0 lb

## 2015-06-28 DIAGNOSIS — M19019 Primary osteoarthritis, unspecified shoulder: Secondary | ICD-10-CM | POA: Insufficient documentation

## 2015-06-28 DIAGNOSIS — M545 Low back pain: Secondary | ICD-10-CM | POA: Diagnosis present

## 2015-06-28 DIAGNOSIS — M13841 Other specified arthritis, right hand: Secondary | ICD-10-CM | POA: Diagnosis not present

## 2015-06-28 DIAGNOSIS — M19012 Primary osteoarthritis, left shoulder: Secondary | ICD-10-CM

## 2015-06-28 DIAGNOSIS — M533 Sacrococcygeal disorders, not elsewhere classified: Secondary | ICD-10-CM | POA: Diagnosis not present

## 2015-06-28 DIAGNOSIS — M13842 Other specified arthritis, left hand: Secondary | ICD-10-CM | POA: Diagnosis not present

## 2015-06-28 DIAGNOSIS — M7061 Trochanteric bursitis, right hip: Secondary | ICD-10-CM

## 2015-06-28 DIAGNOSIS — M5136 Other intervertebral disc degeneration, lumbar region: Secondary | ICD-10-CM | POA: Insufficient documentation

## 2015-06-28 DIAGNOSIS — M19011 Primary osteoarthritis, right shoulder: Secondary | ICD-10-CM

## 2015-06-28 DIAGNOSIS — M79604 Pain in right leg: Secondary | ICD-10-CM | POA: Diagnosis present

## 2015-06-28 DIAGNOSIS — M47818 Spondylosis without myelopathy or radiculopathy, sacral and sacrococcygeal region: Secondary | ICD-10-CM

## 2015-06-28 DIAGNOSIS — M7060 Trochanteric bursitis, unspecified hip: Secondary | ICD-10-CM | POA: Diagnosis not present

## 2015-06-28 DIAGNOSIS — M461 Sacroiliitis, not elsewhere classified: Secondary | ICD-10-CM

## 2015-06-28 DIAGNOSIS — M7062 Trochanteric bursitis, left hip: Secondary | ICD-10-CM

## 2015-06-28 DIAGNOSIS — M79605 Pain in left leg: Secondary | ICD-10-CM | POA: Diagnosis present

## 2015-06-28 MED ORDER — HYDROCODONE-ACETAMINOPHEN 7.5-325 MG PO TABS: ORAL_TABLET | ORAL | Status: DC

## 2015-06-28 MED ORDER — FENTANYL 25 MCG/HR TD PT72: MEDICATED_PATCH | TRANSDERMAL | Status: DC

## 2015-06-28 MED ORDER — FENTANYL 25 MCG/HR TD PT72
MEDICATED_PATCH | TRANSDERMAL | Status: DC
Start: 2015-06-28 — End: 2015-06-28

## 2015-06-28 NOTE — Patient Instructions (Signed)
Continue present medications hydrocodone acetaminophen and fentanyl patch  F/U PCP Dr. Vickki Muff  for evaliation of  BP and general medical  condition  F/U surgical evaluation  F/U neurological evaluation  May consider radiofrequency rhizolysis or intraspinal procedures pending response to present treatment and F/U evaluation   Patient to call [facility] Management Center should patient have concerns prior to scheduled return appointment.

## 2015-06-28 NOTE — Progress Notes (Signed)
   Subjective:    Patient ID: Jordan Duran, female    DOB: December 24, 1955, 59 y.o.   MRN: [id number]  HPI  Patient is 59 year old female returns to [facility] for further evaluation and treatment of pain involving the lower back lower extremity region. Patient without complaint of pain involving the shoulders. Patient states she is doing very well at this time. Patient is without need for hydrocodone acetaminophen and states that the pain is well controlled with fentanyl patch. We discussed patient's condition and will continue present medications of fentanyl patch. Patient will also undergo follow-up evaluation of her condition including rheumatological evaluation as discussed with Dr. Kirkland Hun. The patient was understanding and agrees with suggested treatment plan.    Review of Systems     Objective:   Physical Exam  There was tenderness to palpation splenius capitis and occipitalis musculature regions palpation of the reproduced mild discomfort. There was mild tenderness to palpation of the acromioclavicular glenohumeral joint region.. Tinel and Phalen's maneuver without increased pain of significant degree. Patient appeared to be with bilaterally decreased grip strength.. Nodules were noted about the digits of the hand without increased warmth or erythema noted. There appeared to be unremarkable Spurling's maneuver. Palpation of the thoracic facet thoracic paraspinal musculature region reproduced minimal discomfort. There was minimal spasm of the thoracic paraspinal musculature region of the cervical paraspinal musculature. No crepitus of the thoracic region noted. Palpation over the lumbar paraspinal musculature region lumbar facet region associated with mild discomfort to moderate discomfort. Lateral bending and rotation and extension and palpation of the lumbar facets reproduce mild to moderate's. Straight leg raising was tolerated to 30 without increased pain with dorsiflexion  noted. There was negative clonus negative Homans. No sensory deficit of dermatomal distribution detected. There was mild tenderness of the greater trochanteric region and iliotibial band region. Palpation of the PSIS and PII S regions reproduced minimal discomfort. Abdomen was nontender and no costovertebral tenderness noted.          Assessment & Plan:   Degenerative disc disease lumbar spine Lumbar facet syndrome  Greater trochanteric bursitis  Sacroiliac joint dysfunction  Degenerative joint disease of shoulder  Arthritic changes of joints of hands   PLAN   Continue present medication fentanyl patch. Patient without need for hydrocodone acetaminophen at this time  F/U PCP Dr. Vickki Muff for evaliation of  BP rheumatological condition and general medical  Condition  Rheumatological evaluation. Patient will follow-up with Dr. Vickki Muff in this regard  F/U surgical evaluation will avoid at this time  F/U neurological evaluation will avoid at this time  May consider radiofrequency rhizolysis or intraspinal procedures pending response to present treatment and F/U evaluation   Patient to call Pain Management Center should patient have concerns prior to scheduled return appointment.

## 2015-06-30 ENCOUNTER — Encounter: Payer: 59 | Admitting: Pain Medicine

## 2015-07-26 ENCOUNTER — Ambulatory Visit: Payer: PRIVATE HEALTH INSURANCE | Attending: Pain Medicine | Admitting: Pain Medicine

## 2015-07-26 ENCOUNTER — Encounter: Payer: Self-pay | Admitting: Pain Medicine

## 2015-07-26 VITALS — BP 113/73 | HR 80 | Temp 98.3°F | Resp 18 | Ht 60.0 in | Wt 152.0 lb

## 2015-07-26 DIAGNOSIS — M7061 Trochanteric bursitis, right hip: Secondary | ICD-10-CM

## 2015-07-26 DIAGNOSIS — M533 Sacrococcygeal disorders, not elsewhere classified: Secondary | ICD-10-CM | POA: Diagnosis not present

## 2015-07-26 DIAGNOSIS — M47818 Spondylosis without myelopathy or radiculopathy, sacral and sacrococcygeal region: Secondary | ICD-10-CM

## 2015-07-26 DIAGNOSIS — M25512 Pain in left shoulder: Secondary | ICD-10-CM | POA: Diagnosis present

## 2015-07-26 DIAGNOSIS — M7060 Trochanteric bursitis, unspecified hip: Secondary | ICD-10-CM | POA: Insufficient documentation

## 2015-07-26 DIAGNOSIS — M19019 Primary osteoarthritis, unspecified shoulder: Secondary | ICD-10-CM | POA: Insufficient documentation

## 2015-07-26 DIAGNOSIS — M13849 Other specified arthritis, unspecified hand: Secondary | ICD-10-CM | POA: Diagnosis not present

## 2015-07-26 DIAGNOSIS — M542 Cervicalgia: Secondary | ICD-10-CM | POA: Diagnosis present

## 2015-07-26 DIAGNOSIS — M7062 Trochanteric bursitis, left hip: Secondary | ICD-10-CM

## 2015-07-26 DIAGNOSIS — M19011 Primary osteoarthritis, right shoulder: Secondary | ICD-10-CM

## 2015-07-26 DIAGNOSIS — M5136 Other intervertebral disc degeneration, lumbar region: Secondary | ICD-10-CM

## 2015-07-26 DIAGNOSIS — M19012 Primary osteoarthritis, left shoulder: Secondary | ICD-10-CM

## 2015-07-26 DIAGNOSIS — M25511 Pain in right shoulder: Secondary | ICD-10-CM | POA: Diagnosis present

## 2015-07-26 DIAGNOSIS — M461 Sacroiliitis, not elsewhere classified: Secondary | ICD-10-CM

## 2015-07-26 MED ORDER — FENTANYL 25 MCG/HR TD PT72: MEDICATED_PATCH | TRANSDERMAL | Status: DC

## 2015-07-26 NOTE — Progress Notes (Signed)
   Subjective:    Patient ID: Jordan Duran, female    DOB: 1956/03/07, 59 y.o.   MRN: [id number]  HPI Patient 59 year old female returns a [facility] for further evaluation and treatment of pain involving the neck and shoulders lower back hips and lower extremity regions. Patient states he is doing very well at this time and patient is without the need for Ambien or hydrocodone acetaminophen. Patient states that her pain is well controlled with fentanyl patch. We will also discussed patient undergoing rheumatological evaluation. Patient will follow-up Dr. Trisha Mangle in this regard and proceed with rheumatological evaluation as discussed. We will remain available to consider modification of treatment regimen pending follow-up evaluation of patient as discussed. The patient will continue present medication fentanyl patch and a call pain management should there be change in condition prior to scheduled return appointment. The patient was in agreement with suggested treatment plan.   Review of Systems     Objective:   Physical Exam There was tends to palpation over the splenius capitis and occipitalis musculature regions of mild degree. There was mild tenderness of the acromioclavicular glenohumeral joint region. Patient performed range of motion maneuvers of the shoulder without difficulty. There was unremarkable drop test. There was unremarkable Spurling's maneuver. There was minimal tends to palpation of the cervical facet cervical paraspinal musculature region and thoracic facet thoracic paraspinal musculature region. No crepitus of the thoracic region was noted. Tinel and Phalen's maneuver were without increased pain of significant degree. Patient was with evidence of nodules at the distal interphalangeal joints of the digits of the hands. There was no increased warmth or erythema in the regions of the nodules. Grip strength noted. There was tends to palpation over the lumbar paraspinal  muscle lumbar facet region a minimal degree. No excessive tends to palpation of the PSIS and PII S region noted. Straight leg raising was tolerated to 30 without increased pain with dorsiflexion noted. There was negative clonus negative Homans. There was mild tends to palpation of the greater trochanteric region and iliotibial band region. No sensory deficit of dermatomal distribution detected. DTRs appear to be trace at the knees. Abdomen was nontender with no costovertebral tenderness noted.       Assessment & Plan:     Degenerative disc disease lumbar spine Lumbar facet syndrome  Greater trochanteric bursitis  Sacroiliac joint dysfunction  Degenerative joint disease of shoulder  Arthritic changes of joints of hands     PLAN   Continue present medication Fentanyl patch  F/U PCP  Dr.V Vickki Muff for evaliation of  BP and general medical  condition and to discuss rheumatological evaluation  Rheumatological evaluation of hands. Patient will discuss this with Dr. Vickki Muff  F/U surgical evaluation. May consider pending follow-up evaluations  F/U neurological evaluation. May consider pending follow-up evaluations  May consider radiofrequency rhizolysis or intraspinal procedures pending response to present treatment and F/U evaluation   Patient to call Pain Management Center should patient have concerns prior to scheduled return appointment.

## 2015-07-26 NOTE — Progress Notes (Signed)
Safety precautions to be maintained throughout the outpatient stay will include: orient to surroundings, keep bed in low position, maintain call bell within reach at all times, provide assistance with transfer out of bed and ambulation.  

## 2015-07-26 NOTE — Patient Instructions (Addendum)
PLAN   Continue present medication  F/U PCP Dr. Vickki Muff for evaliation of  BP and general medical  condition as well as rheumatological evaluation as we discussed  F/U surgical evaluation. May consider pending follow-up evaluations  F/U neurological evaluation. May consider pending follow-up evaluations  May consider radiofrequency rhizolysis or intraspinal procedures pending response to present treatment and F/U evaluation   Patient to call [facility] Management Center should patient have concerns prior to scheduled return appointment.

## 2015-07-26 NOTE — Progress Notes (Signed)
   Subjective:    Patient ID: Jordan Duran, female    DOB: 07/23/1956, 59 y.o.   MRN: [id number]  HPI    Review of Systems     Objective:   Physical Exam        Assessment & Plan:   

## 2015-07-28 ENCOUNTER — Encounter: Payer: Self-pay | Admitting: *Deleted

## 2015-07-28 ENCOUNTER — Ambulatory Visit: Payer: 59 | Admitting: Pain Medicine

## 2015-07-29 ENCOUNTER — Ambulatory Visit: Payer: PRIVATE HEALTH INSURANCE | Admitting: Anesthesiology

## 2015-07-29 ENCOUNTER — Ambulatory Visit
Admission: RE | Admit: 2015-07-29 | Discharge: 2015-07-29 | Disposition: A | Discharge status: Home / Self Care | Payer: PRIVATE HEALTH INSURANCE | Source: Ambulatory Visit | Attending: Gastroenterology | Admitting: Gastroenterology

## 2015-07-29 ENCOUNTER — Encounter
Admission: RE | Disposition: A | Discharge status: Home / Self Care | Payer: Self-pay | Source: Ambulatory Visit | Attending: Gastroenterology

## 2015-07-29 ENCOUNTER — Encounter: Payer: Self-pay | Admitting: Anesthesiology

## 2015-07-29 DIAGNOSIS — Z09 Encounter for follow-up examination after completed treatment for conditions other than malignant neoplasm: Secondary | ICD-10-CM | POA: Insufficient documentation

## 2015-07-29 DIAGNOSIS — Z885 Allergy status to narcotic agent status: Secondary | ICD-10-CM | POA: Insufficient documentation

## 2015-07-29 DIAGNOSIS — G8929 Other chronic pain: Secondary | ICD-10-CM | POA: Diagnosis not present

## 2015-07-29 DIAGNOSIS — Z79891 Long term (current) use of opiate analgesic: Secondary | ICD-10-CM | POA: Insufficient documentation

## 2015-07-29 DIAGNOSIS — E785 Hyperlipidemia, unspecified: Secondary | ICD-10-CM | POA: Insufficient documentation

## 2015-07-29 DIAGNOSIS — Z86010 Personal history of colonic polyps: Secondary | ICD-10-CM | POA: Insufficient documentation

## 2015-07-29 DIAGNOSIS — Z79899 Other long term (current) drug therapy: Secondary | ICD-10-CM | POA: Diagnosis not present

## 2015-07-29 DIAGNOSIS — M1990 Unspecified osteoarthritis, unspecified site: Secondary | ICD-10-CM | POA: Insufficient documentation

## 2015-07-29 DIAGNOSIS — Z85820 Personal history of malignant melanoma of skin: Secondary | ICD-10-CM | POA: Insufficient documentation

## 2015-07-29 DIAGNOSIS — G709 Myoneural disorder, unspecified: Secondary | ICD-10-CM | POA: Diagnosis not present

## 2015-07-29 DIAGNOSIS — Z87891 Personal history of nicotine dependence: Secondary | ICD-10-CM | POA: Diagnosis not present

## 2015-07-29 DIAGNOSIS — Z882 Allergy status to sulfonamides status: Secondary | ICD-10-CM | POA: Insufficient documentation

## 2015-07-29 DIAGNOSIS — F329 Major depressive disorder, single episode, unspecified: Secondary | ICD-10-CM | POA: Diagnosis not present

## 2015-07-29 DIAGNOSIS — R011 Cardiac murmur, unspecified: Secondary | ICD-10-CM | POA: Diagnosis not present

## 2015-07-29 HISTORY — DX: Unspecified fracture of t11-T12 vertebra, initial encounter for closed fracture: S22.089A

## 2015-07-29 HISTORY — DX: Hyperlipidemia, unspecified: E78.5

## 2015-07-29 HISTORY — DX: Unspecified osteoarthritis, unspecified site: M19.90

## 2015-07-29 HISTORY — DX: Unspecified fracture of first lumbar vertebra, initial encounter for closed fracture: S32.019A

## 2015-07-29 HISTORY — PX: COLONOSCOPY WITH PROPOFOL: SHX5780

## 2015-07-29 HISTORY — DX: Urinary tract infection, site not specified: N39.0

## 2015-07-29 SURGERY — COLONOSCOPY WITH PROPOFOL
Anesthesia: General

## 2015-07-29 MED ORDER — PHENYLEPHRINE HCL 10 MG/ML IJ SOLN
INTRAMUSCULAR | Status: DC | PRN
  Administered 2015-07-29: 100 ug via INTRAVENOUS

## 2015-07-29 MED ORDER — PROPOFOL 10 MG/ML IV BOLUS
INTRAVENOUS | Status: DC | PRN
  Administered 2015-07-29: 40 mg via INTRAVENOUS

## 2015-07-29 MED ORDER — SODIUM CHLORIDE 0.9 % IV SOLN: INTRAVENOUS | Status: DC

## 2015-07-29 MED ORDER — PROPOFOL 500 MG/50ML IV EMUL
INTRAVENOUS | Status: DC | PRN
  Administered 2015-07-29: 140 ug/kg/min via INTRAVENOUS

## 2015-07-29 MED ORDER — LIDOCAINE HCL (CARDIAC) 20 MG/ML IV SOLN
INTRAVENOUS | Status: DC | PRN
  Administered 2015-07-29: 60 mg via INTRAVENOUS

## 2015-07-29 MED ORDER — SODIUM CHLORIDE 0.9 % IV SOLN
INTRAVENOUS | Status: DC
Start: 2015-07-29 — End: 2015-07-29
  Administered 2015-07-29: 1000 mL via INTRAVENOUS

## 2015-07-29 NOTE — H&P (Signed)
Primary Care Physician:  Chrisandra Carota, MD Primary Gastroenterologist:  Dr. Candace Cruise  Pre-Procedure History & Physical: HPI:  Jordan Duran is a 59 y.o. female is here for an colonoscopy.   Past Medical History  Diagnosis Date  . Heart murmur   . Depression   . Cancer     Melanoma of the Skin  . Fracture of T12 vertebra   . Fracture of L1 vertebra   . Recurrent UTI   . Arthritis   . Hyperlipidemia     Past Surgical History  Procedure Laterality Date  . Abdominal hysterectomy    . Cesarean section    . Tubal ligation    . Cholecystectomy      Prior to Admission medications   Medication Sig Start Date End Date Taking? Authorizing Provider  citalopram (CELEXA) 20 MG tablet Take 20 mg by mouth daily.   Yes Historical Provider, MD  fentaNYL (DURAGESIC - DOSED MCG/HR) 25 MCG/HR patch Apply 1 patch to skin every 2 days if tolerated 07/26/15  Yes Mohammed Kindle, MD  HYDROcodone-acetaminophen (NORCO) 7.5-325 MG tablet Take 1 tablet by mouth every 6 (six) hours as needed for moderate pain.   Yes Historical Provider, MD  nitrofurantoin (MACRODANTIN) 100 MG capsule Take 100 mg by mouth 4 (four) times daily.   Yes Historical Provider, MD  nortriptyline (PAMELOR) 25 MG capsule Take 25 mg by mouth at bedtime.   Yes Historical Provider, MD    Allergies as of 06/20/2015 - Review Complete 06/02/2015  Allergen Reaction Noted  . Sulfa antibiotics Hives 03/10/2015  . Demerol [meperidine] Nausea And Vomiting 06/02/2015    Family History  Problem Relation Age of Onset  . Arthritis Mother   . Depression Mother   . Diabetes Mother   . Early death Mother   . Varicose Veins Mother   . Heart disease Father   . Hypertension Father   . Arthritis Sister   . Depression Sister   . Drug abuse Sister   . Depression Son   . Diabetes Maternal Aunt   . Depression Maternal Aunt   . Hypertension Maternal Grandmother   . Stroke Maternal Grandmother   . Varicose Veins Maternal Grandmother   .  Diabetes Paternal Grandmother   . Arthritis Paternal Grandmother     Social History   Social History  . Marital Status: Married    Spouse Name: N/A  . Number of Children: N/A  . Years of Education: N/A   Occupational History  . Not on file.   Social History Main Topics  . Smoking status: Former Research scientist (life sciences)  . Smokeless tobacco: Former Systems developer    Quit date: 07/10/1974  . Alcohol Use: No  . Drug Use: No  . Sexual Activity: Not on file   Other Topics Concern  . Not on file   Social History Narrative    Review of Systems: See HPI, otherwise negative ROS  Physical Exam: BP 153/66 mmHg  Pulse 85  Temp(Src) 98.9 F (37.2 C) (Tympanic)  Resp 16  Ht 5' (1.524 m)  Wt 68.493 kg (151 lb)  BMI 29.49 kg/m2  SpO2 96% General:   Alert,  pleasant and cooperative in NAD Head:  Normocephalic and atraumatic. Neck:  Supple; no masses or thyromegaly. Lungs:  Clear throughout to auscultation.    Heart:  Regular rate and rhythm. Abdomen:  Soft, nontender and nondistended. Normal bowel sounds, without guarding, and without rebound.   Neurologic:  Alert and  oriented x4;  grossly normal  neurologically.  Impression/Plan: [name] is here for an colonoscopy to be performed for personal hx of colon polyps.  Risks, benefits, limitations, and alternatives regarding  colonoscopy have been reviewed with the patient.  Questions have been answered.  All parties agreeable.   OH, Lupita Dawn, MD  07/29/2015, 8:01 AM

## 2015-07-29 NOTE — Op Note (Signed)
[facility] Gastroenterology Patient Name: Jordan Duran Procedure Date: 07/29/2015 8:06 AM MRN: [id number] Account #: [id number] Date of Birth: 1956-03-29 Admit Type: Outpatient Age: 59 Room: [facility] ENDO ROOM 4 Gender: Female Note Status: Finalized Procedure:         Colonoscopy Indications:       Personal history of colonic polyps Providers:         Lupita Dawn. Candace Cruise, MD Referring MD:      Forest Gleason Md, MD (Referring MD) Medicines:         Monitored Anesthesia Care Complications:     No immediate complications. Procedure:         Pre-Anesthesia Assessment:                    - Prior to the procedure, a History and Physical was                     performed, and patient medications, allergies and                     sensitivities were reviewed. The patient's tolerance of                     previous anesthesia was reviewed.                    - The risks and benefits of the procedure and the sedation                     options and risks were discussed with the patient. All                     questions were answered and informed consent was obtained.                    - After reviewing the risks and benefits, the patient was                     deemed in satisfactory condition to undergo the procedure.                    After obtaining informed consent, the colonoscope was                     passed under direct vision. Throughout the procedure, the                     patient's blood pressure, pulse, and oxygen saturations                     were monitored continuously. The Colonoscope was                     introduced through the anus and advanced to the the cecum,                     identified by appendiceal orifice and ileocecal valve. The                     colonoscopy was performed without difficulty. The patient                     tolerated the procedure well. The quality of the bowel  preparation was good. Findings:      The colon (entire  examined portion) appeared normal. Impression:        - The entire examined colon is normal.                    - No specimens collected. Recommendation:    - Discharge patient to home.                    - Repeat colonoscopy in 5 years for surveillance.                    - The findings and recommendations were discussed with the                     patient. Procedure Code(s): --- Professional ---                    [phone], Colonoscopy, flexible; diagnostic, including                     collection of specimen(s) by brushing or washing, when                     performed (separate procedure) Diagnosis Code(s): --- Professional ---                    Z86.010, Personal history of colonic polyps CPT copyright 2014 American Medical Association. All rights reserved. The codes documented in this report are preliminary and upon coder review may  be revised to meet current compliance requirements. Hulen Luster, MD 07/29/2015 8:30:57 AM This report has been signed electronically. Number of Addenda: 0 Note Initiated On: 07/29/2015 8:06 AM Scope Withdrawal Time: 0 hours 13 minutes 6 seconds  Total Procedure Duration: 0 hours 19 minutes 5 seconds       [facility]

## 2015-07-29 NOTE — Transfer of Care (Signed)
Immediate Anesthesia Transfer of Care Note  Patient: Jordan Duran  Procedure(s) Performed: Procedure(s): COLONOSCOPY WITH PROPOFOL (N/A)  Patient Location: Endoscopy Unit  Anesthesia Type:General  Level of Consciousness: awake, alert , oriented and patient cooperative  Airway & Oxygen Therapy: Patient Spontanous Breathing and Patient connected to nasal cannula oxygen  Post-op Assessment: Report given to RN, Post -op Vital signs reviewed and stable and Patient moving all extremities X 4  Post vital signs: Reviewed and stable  Last Vitals:  Filed Vitals:   07/29/15 0835  BP: 121/63  Pulse: 67  Temp: 35.7 C  Resp: 10    Complications: No apparent anesthesia complications

## 2015-07-29 NOTE — Anesthesia Preprocedure Evaluation (Signed)
Anesthesia Evaluation  Patient identified by MRN, date of birth, ID band Patient awake    Reviewed: Allergy & Precautions, H&P , NPO status , Patient's Chart, lab work & pertinent test results  Airway Mallampati: II  TM Distance: >3 FB Neck ROM: full    Dental no notable dental hx. (+) Teeth Intact   Pulmonary neg pulmonary ROS, former smoker,    Pulmonary exam normal breath sounds clear to auscultation       Cardiovascular Exercise Tolerance: Good (-) Past MI Normal cardiovascular exam+ Valvular Problems/Murmurs  Rhythm:regular Rate:Normal     Neuro/Psych PSYCHIATRIC DISORDERS Depression  Neuromuscular disease negative psych ROS   GI/Hepatic negative GI ROS, Neg liver ROS, neg GERD  ,  Endo/Other  negative endocrine ROS  Renal/GU negative Renal ROS  negative genitourinary   Musculoskeletal  (+) Arthritis ,   Abdominal   Peds  Hematology negative hematology ROS (+)   Anesthesia Other Findings Past Medical History:   Heart murmur                                                 Depression                                                   Cancer                                                         Comment:Melanoma of the Skin   Fracture of T12 vertebra                                     Fracture of L1 vertebra                                      Recurrent UTI                                                Arthritis                                                    Hyperlipidemia                                              Chronic Pain  Reproductive/Obstetrics negative OB ROS                             Anesthesia Physical Anesthesia Plan  ASA:  III  Anesthesia Plan: General   Post-op Pain Management:    Induction:   Airway Management Planned:   Additional Equipment:   Intra-op Plan:   Post-operative Plan:   Informed Consent: I have reviewed the patients History and  Physical, chart, labs and discussed the procedure including the risks, benefits and alternatives for the proposed anesthesia with the patient or authorized representative who has indicated his/her understanding and acceptance.   Dental Advisory Given  Plan Discussed with: Anesthesiologist, CRNA and Surgeon  Anesthesia Plan Comments:         Anesthesia Quick Evaluation

## 2015-07-29 NOTE — Anesthesia Postprocedure Evaluation (Signed)
  Anesthesia Post-op Note  Patient: Jordan Duran  Procedure(s) Performed: Procedure(s): COLONOSCOPY WITH PROPOFOL (N/A)  Anesthesia type:General  Patient location: PACU  Post pain: Pain level controlled  Post assessment: Post-op Vital signs reviewed, Patient's Cardiovascular Status Stable, Respiratory Function Stable, Patent Airway and No signs of Nausea or vomiting  Post vital signs: Reviewed and stable  Last Vitals:  Filed Vitals:   07/29/15 0905  BP: 125/76  Pulse: 75  Temp:   Resp: 10    Level of consciousness: awake, alert  and patient cooperative  Complications: No apparent anesthesia complications

## 2015-08-03 ENCOUNTER — Other Ambulatory Visit: Payer: Self-pay | Admitting: Pain Medicine

## 2015-08-19 ENCOUNTER — Encounter: Payer: Self-pay | Admitting: Gastroenterology

## 2015-08-25 ENCOUNTER — Ambulatory Visit: Payer: 59 | Attending: Pain Medicine | Admitting: Pain Medicine

## 2015-08-25 ENCOUNTER — Encounter: Payer: Self-pay | Admitting: Pain Medicine

## 2015-08-25 VITALS — BP 144/88 | HR 91 | Temp 97.1°F | Resp 18 | Ht 60.0 in | Wt 150.0 lb

## 2015-08-25 DIAGNOSIS — M5136 Other intervertebral disc degeneration, lumbar region: Secondary | ICD-10-CM | POA: Insufficient documentation

## 2015-08-25 DIAGNOSIS — M19019 Primary osteoarthritis, unspecified shoulder: Secondary | ICD-10-CM | POA: Diagnosis not present

## 2015-08-25 DIAGNOSIS — M7060 Trochanteric bursitis, unspecified hip: Secondary | ICD-10-CM | POA: Insufficient documentation

## 2015-08-25 DIAGNOSIS — M47818 Spondylosis without myelopathy or radiculopathy, sacral and sacrococcygeal region: Secondary | ICD-10-CM

## 2015-08-25 DIAGNOSIS — M19011 Primary osteoarthritis, right shoulder: Secondary | ICD-10-CM

## 2015-08-25 DIAGNOSIS — M1990 Unspecified osteoarthritis, unspecified site: Secondary | ICD-10-CM | POA: Diagnosis not present

## 2015-08-25 DIAGNOSIS — M19012 Primary osteoarthritis, left shoulder: Secondary | ICD-10-CM

## 2015-08-25 DIAGNOSIS — M7061 Trochanteric bursitis, right hip: Secondary | ICD-10-CM

## 2015-08-25 DIAGNOSIS — M461 Sacroiliitis, not elsewhere classified: Secondary | ICD-10-CM

## 2015-08-25 DIAGNOSIS — M7062 Trochanteric bursitis, left hip: Secondary | ICD-10-CM

## 2015-08-25 MED ORDER — FENTANYL 25 MCG/HR TD PT72: MEDICATED_PATCH | TRANSDERMAL | Status: DC

## 2015-08-25 NOTE — Progress Notes (Signed)
Safety precautions to be maintained throughout the outpatient stay will include: orient to surroundings, keep bed in low position, maintain call bell within reach at all times, provide assistance with transfer out of bed and ambulation.  

## 2015-08-25 NOTE — Progress Notes (Signed)
   Subjective:    Patient ID: Jordan Duran, female    DOB: 14-Dec-1955, 59 y.o.   MRN: [id number]  HPI  Patient is 59 year old female returns to [facility] for further evaluation and treatment of pain involving the upper extremity region with shoulder hands lower back and lower extremity regions. Patient is undergone follow-up evaluation of her hands and rheumatological condition with Dr. Vickki Muff and is without need for further rheumatological evaluation. Patient stated that she was without evidence of rheumatoid arthritis. The patient states present time that her pain is fairly well-controlled in the lower back lower extremity region and region of the shoulder. Patient also denies any significant discomfort due to greater trochanteric bursitis. We will continue fentanyl patch. Patient without need for hydrocodone acetaminophen or Ambien at this time. All understanding and in agreement with suggested treatment plan    Review of Systems     Objective:   Physical Exam  There was tends to palpation of paraspinal muscular region cervical and cervical facet region a minimal degree. There was minimal tenderness of the splenius capitis and occipitalis musculature regions. Patient was with bilaterally equal grip strength. There was minimal tenderness of the acromioclavicular and glenohumeral joint region. Patient was with unremarkable Spurling's maneuver and was able to perform drop test without difficulty. Palpation of the thoracic facet thoracic paraspinal musculature region was without significant tends to palpation and no crepitus of the thoracic region was noted. Palpation over the region of the lumbar paraspinal musculature region lumbar facet region was a tends to palpation of moderate degree with lateral bending and rotation and extension and palpation of the lumbar facets reproducing moderately discomfort. There was mild tinnitus of the greater trochanteric region iliotibial band region.  Straight leg raising was tolerates approximately 30 without increased pain with dorsiflexion noted. DTRs were difficult to elicit patient had difficulty relaxing. EHL strength appeared to be slightly decreased. There was no sensory deficit of dermatomal to be detected. There was negative clonus negative Homans. Minimal tenderness to palpation of the PSIS and PII S region. Minimal tenderness of the gluteal and piriformis muscular regions. Abdomen was nontender with no costovertebral angle tenderness noted. There was negative clonus negative Homans.    Assessment & Plan:    Degenerative disc disease lumbar spine Lumbar facet syndrome  Greater trochanteric bursitis  Sacroiliac joint dysfunction  Degenerative joint disease of shoulder  Arthritic changes of joints of hands   PLAN   Continue present medication fentanyl patch. Patient without need for hydrocodone acetaminophen at this time  F/U PCP Dr. Vickki Muff for evaliation of  BP rheumatological condition and general medical  condition. Patient states that she is without evidence of rheumatoid arthritis   Rheumatological evaluation. We will avoid rheumatological evaluation at this time. Patient without evidence of rheumatoid arthritis and will follow-up with Dr. Vickki Muff as planned in this regard  F/U surgical evaluation will avoid at this time  F/U neurological evaluation will avoid at this time  May consider radiofrequency rhizolysis or intraspinal procedures pending response to present treatment and F/U evaluation . Patient is with pain well-controlled at this time we will avoid considering such treatment as these treatments   Patient to call Pain Management Center should patient have concerns prior to scheduled return appointment.

## 2015-08-25 NOTE — Patient Instructions (Addendum)
PLAN   Continue present medications fentanyl patch and hydrocodone acetaminophen  F/U PCP Dr. Vickki Muff for evaliation of  BP and general medical  condition   F/U surgical evaluation. May consider pending follow-up evaluations  Rheumatological evaluation. Will avoid at this time. Patient will continue the care of Dr. Vickki Muff in this regard  F/U neurological evaluation. May consider pending follow-up evaluations  May consider radiofrequency rhizolysis or intraspinal procedures pending response to present treatment and F/U evaluation

## 2015-08-25 NOTE — Progress Notes (Signed)
   Subjective:    Patient ID: Jordan Duran, female    DOB: 04/06/1956, 59 y.o.   MRN: [id number]  HPI    Review of Systems     Objective:   Physical Exam        Assessment & Plan:   

## 2015-09-26 ENCOUNTER — Ambulatory Visit: Payer: PRIVATE HEALTH INSURANCE | Attending: Pain Medicine | Admitting: Pain Medicine

## 2015-09-26 ENCOUNTER — Encounter: Payer: Self-pay | Admitting: Pain Medicine

## 2015-09-26 VITALS — BP 124/75 | HR 95 | Temp 98.6°F | Resp 16 | Ht 60.0 in | Wt 150.0 lb

## 2015-09-26 DIAGNOSIS — M5136 Other intervertebral disc degeneration, lumbar region: Secondary | ICD-10-CM | POA: Insufficient documentation

## 2015-09-26 DIAGNOSIS — M13842 Other specified arthritis, left hand: Secondary | ICD-10-CM | POA: Diagnosis not present

## 2015-09-26 DIAGNOSIS — M461 Sacroiliitis, not elsewhere classified: Secondary | ICD-10-CM

## 2015-09-26 DIAGNOSIS — M7062 Trochanteric bursitis, left hip: Secondary | ICD-10-CM

## 2015-09-26 DIAGNOSIS — M13841 Other specified arthritis, right hand: Secondary | ICD-10-CM | POA: Diagnosis not present

## 2015-09-26 DIAGNOSIS — M545 Low back pain: Secondary | ICD-10-CM | POA: Diagnosis present

## 2015-09-26 DIAGNOSIS — M19011 Primary osteoarthritis, right shoulder: Secondary | ICD-10-CM

## 2015-09-26 DIAGNOSIS — M47818 Spondylosis without myelopathy or radiculopathy, sacral and sacrococcygeal region: Secondary | ICD-10-CM

## 2015-09-26 DIAGNOSIS — M7060 Trochanteric bursitis, unspecified hip: Secondary | ICD-10-CM | POA: Diagnosis not present

## 2015-09-26 DIAGNOSIS — M7061 Trochanteric bursitis, right hip: Secondary | ICD-10-CM

## 2015-09-26 DIAGNOSIS — M19012 Primary osteoarthritis, left shoulder: Secondary | ICD-10-CM

## 2015-09-26 DIAGNOSIS — M51369 Other intervertebral disc degeneration, lumbar region without mention of lumbar back pain or lower extremity pain: Secondary | ICD-10-CM

## 2015-09-26 DIAGNOSIS — M533 Sacrococcygeal disorders, not elsewhere classified: Secondary | ICD-10-CM | POA: Diagnosis not present

## 2015-09-26 DIAGNOSIS — M19019 Primary osteoarthritis, unspecified shoulder: Secondary | ICD-10-CM | POA: Diagnosis not present

## 2015-09-26 DIAGNOSIS — M79604 Pain in right leg: Secondary | ICD-10-CM | POA: Diagnosis present

## 2015-09-26 DIAGNOSIS — M79605 Pain in left leg: Secondary | ICD-10-CM | POA: Diagnosis present

## 2015-09-26 MED ORDER — FENTANYL 25 MCG/HR TD PT72: MEDICATED_PATCH | TRANSDERMAL | Status: DC

## 2015-09-26 NOTE — Patient Instructions (Signed)
PLAN   Continue present medications fentanyl patch and hydrocodone acetaminophen  F/U PCP Dr. Vickki Muff for evaliation of  BP and general medical  condition   F/U surgical evaluation. May consider pending follow-up evaluations  Rheumatological evaluation. Will avoid at this time. Patient will continue the care of Dr. Vickki Muff in this regard as previously discussed  F/U neurological evaluation. May consider pending follow-up evaluations  May consider radiofrequency rhizolysis or intraspinal procedures pending response to present treatment and F/U evaluation   Patient is to call pain management prior to scheduled return appointment should patient have concerns regarding condition

## 2015-09-26 NOTE — Progress Notes (Signed)
   Subjective:    Patient ID: Jordan Duran, female    DOB: 06-21-1956, 59 y.o.   MRN: [id number]  HPI The patient is a 59 year old female who returns to pain management for further evaluation and treatment of pain involving the lower back and lower extremity regions. The patient also has had pain involving the shoulder as well as pain involving the hands. The patient is undergone rheumatological evaluation by Dr. Vickki Muff and we'll continue under the care of Dr. Vickki Muff in this regard. The patient was without evidence of rheumatoid arthritis on prior studies. The patient states the pain is fairly well-controlled in all regions at this time and we will continue fentanyl patch as prescribed. The patient denies any trauma change in events of daily living the cost change in symptomatology. Treatment plan   Review of Systems     Objective:   Physical Exam  There was mild tenderness to palpation of the splenius capitis and occipitalis region a mild tenderness of the cervical facet cervical paraspinal musculature region. Palpation of the acromioclavicular and glenohumeral joint regions reproduced pain of mild degree. Patient was at unremarkable drop test. Patient was with evidence of nodule formation of the digits of the hand without increased warmth or erythema noted. There was mild tenderness to palpation of the medial and lateral epicondyles of the elbows of the left and right upper extremities. Tinel and Phalen's maneuver without increased pain of mild to moderate degree. Palpation over the region of the thoracic facet thoracic paraspinal musculature region reproduced pain of mild degree. There was mild to moderate tenderness over the lumbar facet lumbar paraspinal musculature region. Lateral bending rotation extension and palpation of the lumbar facets reproduce mild discomfort. Straight leg raise was tolerated to 30 without increased pain with dorsiflexion noted. There was minimal tenderness of the PSIS  and PSIS region as well as the greater trochanteric region and iliotibial band region. No sensory deficit or dermatomal distribution detected there was negative clonus negative Homans. Abdomen nontender with no costovertebral tenderness noted.      Assessment & Plan:    Degenerative disc disease lumbar spine Lumbar facet syndrome  Greater trochanteric bursitis  Sacroiliac joint dysfunction  Degenerative joint disease of shoulder  Arthritic changes of joints of hands    PLAN   Continue present medications fentanyl patch and hydrocodone acetaminophen  F/U PCP Dr. Vickki Muff for evaliation of  BP and general medical  condition   F/U surgical evaluation. May consider pending follow-up evaluations  Rheumatological evaluation. Will avoid at this time. Patient will continue the care of Dr. Vickki Muff in this regard as previously discussed  F/U neurological evaluation. May consider pending follow-up evaluations  May consider radiofrequency rhizolysis or intraspinal procedures pending response to present treatment and F/U evaluation   Patient is to call pain management prior to scheduled return appointment should patient have concerns regarding

## 2015-09-26 NOTE — Progress Notes (Signed)
Safety precautions to be maintained throughout the outpatient stay will include: orient to surroundings, keep bed in low position, maintain call bell within reach at all times, provide assistance with transfer out of bed and ambulation.  

## 2015-10-27 ENCOUNTER — Encounter: Payer: Self-pay | Admitting: Pain Medicine

## 2015-10-27 ENCOUNTER — Ambulatory Visit: Payer: PRIVATE HEALTH INSURANCE | Attending: Pain Medicine | Admitting: Pain Medicine

## 2015-10-27 VITALS — BP 129/80 | HR 88 | Temp 98.6°F | Resp 16 | Ht 60.0 in | Wt 155.0 lb

## 2015-10-27 DIAGNOSIS — M19019 Primary osteoarthritis, unspecified shoulder: Secondary | ICD-10-CM | POA: Diagnosis not present

## 2015-10-27 DIAGNOSIS — M7062 Trochanteric bursitis, left hip: Secondary | ICD-10-CM

## 2015-10-27 DIAGNOSIS — M7060 Trochanteric bursitis, unspecified hip: Secondary | ICD-10-CM | POA: Diagnosis not present

## 2015-10-27 DIAGNOSIS — M19012 Primary osteoarthritis, left shoulder: Secondary | ICD-10-CM

## 2015-10-27 DIAGNOSIS — M19011 Primary osteoarthritis, right shoulder: Secondary | ICD-10-CM

## 2015-10-27 DIAGNOSIS — M545 Low back pain: Secondary | ICD-10-CM | POA: Diagnosis present

## 2015-10-27 DIAGNOSIS — M79606 Pain in leg, unspecified: Secondary | ICD-10-CM | POA: Diagnosis present

## 2015-10-27 DIAGNOSIS — M5136 Other intervertebral disc degeneration, lumbar region: Secondary | ICD-10-CM | POA: Insufficient documentation

## 2015-10-27 DIAGNOSIS — M47818 Spondylosis without myelopathy or radiculopathy, sacral and sacrococcygeal region: Secondary | ICD-10-CM

## 2015-10-27 DIAGNOSIS — M533 Sacrococcygeal disorders, not elsewhere classified: Secondary | ICD-10-CM | POA: Diagnosis not present

## 2015-10-27 DIAGNOSIS — M461 Sacroiliitis, not elsewhere classified: Secondary | ICD-10-CM

## 2015-10-27 DIAGNOSIS — M7061 Trochanteric bursitis, right hip: Secondary | ICD-10-CM

## 2015-10-27 MED ORDER — FENTANYL 25 MCG/HR TD PT72: MEDICATED_PATCH | TRANSDERMAL | Status: DC

## 2015-10-27 NOTE — Progress Notes (Signed)
Safety precautions to be maintained throughout the outpatient stay will include: orient to surroundings, keep bed in low position, maintain call bell within reach at all times, provide assistance with transfer out of bed and ambulation.  

## 2015-10-27 NOTE — Patient Instructions (Signed)
PLAN   Continue present medications fentanyl patch and hydrocodone acetaminophen  F/U PCP Dr. Vickki Muff for evaliation of  BP and general medical  condition   F/U surgical evaluation. May consider pending follow-up evaluations  Rheumatological evaluation. Will avoid at this time. Patient will continue the care of Dr. Vickki Muff in this regard as previously discussed  F/U neurological evaluation. May consider pending follow-up evaluations  May consider radiofrequency rhizolysis or intraspinal procedures pending response to present treatment and F/U evaluation . We will avoid these procedures at this time  Patient is to call pain management prior to scheduled return appointment should patient have concerns regarding condition

## 2015-10-27 NOTE — Progress Notes (Signed)
   Subjective:    Patient ID: Jordan Duran, female    DOB: September 14, 1956, 59 y.o.   MRN: [id number]  HPI  The patient is a 59 year old female who returns to pain management Center for evaluation and treatment of pain involving the lower back and lower extremity region and region of the shoulder. The patient is with pain of the lower back and lower extremity region fairly well-controlled. The patient is without significant pain involving the shoulder as well. The patient has had pain involving the hands and feels that he has been some exacerbation of pain involving the hands since [place] whether area the patient will follow-up with Dr. Vickki Muff for further assessment of her condition. Rheumatological evaluation as been addressed and patient may be considered facets. At the present time she will follow-up with Dr. follow-up as planned. The patient denies any trauma change in events of daily living the call significant change in symptomatology. We will continue fentanyl patch at this time and we'll remain available to consider additional modifications of treatment pending response to treatment and follow-up evaluation. The patient agreed to suggested treatment plan.    Review of Systems     Objective:   Physical Exam  There was tends to palpation of the splenius capitis and occipitalis musculature region a mild degree. There was mild tenderness of the cervical facet cervical paraspinal musculature region. Palpation of the acromioclavicular and glenohumeral joint regions reproduces minimal discomfort. Palpation over the cervical facet cervical paraspinal musculature region and thoracic facet thoracic paraspinal musculature region reproduces mild discomfort. No crepitus of the thoracic region was noted. Palpation over the lumbar paraspinal musculature region lumbar facet region was attends to palpation of mild to moderate degree. Lateral bending rotation extension and palpation of the lumbar facets  reproduced moderate discomfort. There was tenderness over the region of the PSIS PII S region a moderate degree with moderate tenderness to palpation over the greater trochanteric region iliotibial band region. Straight leg raising tolerate approximately 20 without a definite increased pain with dorsiflexion noted. EHL strength appeared to be decreased. There was negative clonus negative Homans. Abdomen was nontender with no costovertebral tenderness noted. DTRs were difficult to this patient had difficulty relaxing. The hands were with evidence of nodules involving the hands without definite increased warmth and erythema of the digits noted. Abdomen nontender and no costovertebral tenderness was noted.      Assessment & Plan:    Degenerative disc disease lumbar spine Lumbar facet syndrome  Greater trochanteric bursitis  Sacroiliac joint dysfunction  Degenerative joint disease of shoulder        PLAN   Continue present medications fentanyl patch and hydrocodone acetaminophen  F/U PCP Dr. Vickki Muff for evaliation of  BP and general medical  condition   F/U surgical evaluation. May consider pending follow-up evaluations  Rheumatological evaluation. Will avoid at this time. Patient will continue the care of Dr. Vickki Muff in this regard as previously discussed  F/U neurological evaluation. May consider pending follow-up evaluations  May consider radiofrequency rhizolysis or intraspinal procedures pending response to present treatment and F/U evaluation . We will avoid these procedures at this time  Patient is to call pain management prior to scheduled return appointment should patient have concerns regarding condition

## 2015-11-23 ENCOUNTER — Other Ambulatory Visit: Payer: Self-pay | Admitting: Pain Medicine

## 2015-11-23 ENCOUNTER — Ambulatory Visit: Payer: 59 | Attending: Pain Medicine | Admitting: Pain Medicine

## 2015-11-23 ENCOUNTER — Encounter: Payer: Self-pay | Admitting: Pain Medicine

## 2015-11-23 VITALS — BP 121/79 | HR 84 | Temp 97.9°F | Resp 16 | Ht 60.0 in | Wt 155.0 lb

## 2015-11-23 DIAGNOSIS — M47818 Spondylosis without myelopathy or radiculopathy, sacral and sacrococcygeal region: Secondary | ICD-10-CM

## 2015-11-23 DIAGNOSIS — M19011 Primary osteoarthritis, right shoulder: Secondary | ICD-10-CM

## 2015-11-23 DIAGNOSIS — M5136 Other intervertebral disc degeneration, lumbar region: Secondary | ICD-10-CM

## 2015-11-23 DIAGNOSIS — M7061 Trochanteric bursitis, right hip: Secondary | ICD-10-CM

## 2015-11-23 DIAGNOSIS — M7062 Trochanteric bursitis, left hip: Principal | ICD-10-CM

## 2015-11-23 DIAGNOSIS — M461 Sacroiliitis, not elsewhere classified: Secondary | ICD-10-CM

## 2015-11-23 DIAGNOSIS — M19012 Primary osteoarthritis, left shoulder: Secondary | ICD-10-CM

## 2015-11-23 MED ORDER — FENTANYL 25 MCG/HR TD PT72: MEDICATED_PATCH | TRANSDERMAL | Status: DC

## 2015-11-23 NOTE — Progress Notes (Signed)
Safety precautions to be maintained throughout the outpatient stay will include: orient to surroundings, keep bed in low position, maintain call bell within reach at all times, provide assistance with transfer out of bed and ambulation.   Patient here for medication refill

## 2015-11-23 NOTE — Patient Instructions (Signed)
PLAN   Continue present medications fentanyl patch and hydrocodone acetaminophen  F/U PCP Dr. Vickki Muff for evaliation of  BP and general medical  condition   F/U surgical evaluation. May consider pending follow-up evaluations  Rheumatological evaluation. Will avoid at this time. Patient will continue the care of Dr. Vickki Muff in this regard as previously discussed  F/U neurological evaluation. May consider pending follow-up evaluations  May consider radiofrequency rhizolysis or intraspinal procedures pending response to present treatment and F/U evaluation . We will avoid these procedures at this time  Patient is to call pain management prior to scheduled return appointment should patient have concerns regarding condition

## 2015-11-23 NOTE — Progress Notes (Signed)
   Subjective:    Patient ID: Jordan Duran, female    DOB: 01-02-1956, 60 y.o.   MRN: [id number]  HPI The patient is a 60 year old female who returns to pain management for further evaluation and treatment of pain involving the neck shoulders and have back upper and lower extremity regions. There is been concern regarding arthritic condition. The patient's symptomatology. The patient will continue to follow-up with Dr. Vickki Muff in this regard and will consider rheumatological evaluation as discussed. A severe pain is fairly well-controlled at this time and she is without the need of hydrocodone acetaminophen. The patient continues to use of fentanyl patch. We will remain available to consider patient for interventional treatment as well as additional modifications of treatment regimen pending follow-up evaluation.At the present time patient is able to perform most activities of daily living without severe disabling pain interfering with activities of daily living or ability to obtain restful sleep. The patient agreed to suggested treatment plan   Review of Systems     Objective:   Physical Exam  There was tenderness of the splenius capitis and a separate talus musculature regions. Palpation over the cervical facet cervical paraspinal musculature region was with mild to moderate discomfort. Patient was with mild tenderness of the trapezius levator scapula and rhomboid musculature regions with mild tenderness of the acromioclavicular and glenohumeral joint region. Nodules of the digits of the hand were noted without increased warmth and erythema noted and patient was with slightly decreased grip strength with out significant increase of pain with Tinel and Phalen's maneuver. There was tenderness over the thoracic facet thoracic paraspinal musculature region a mild degree with no crepitus of the thoracic region noted. There was tenderness over the lumbar paraspinal musculatures and lumbar facet region a  mild degree with lateral bending rotation extension and palpation of lumbar facets reproducing minimal discomfort while with mild tenderness of the PSIS and PII S region as well as the gluteal and piriformis musculature regions. Straight leg raising was tolerated to 30 without increased pain with dorsiflexion noted with negative clonus negative Homans. Abdomen nontender with no costovertebral tenderness noted there was minimal tenderness of the greater trochanteric region and iliotibial band region.      Assessment & Plan:       Degenerative disc disease lumbar spine Lumbar facet syndrome  Greater trochanteric bursitis  Sacroiliac joint dysfunction  Degenerative joint disease of shoulder     PLAN   Continue present medications fentanyl patch and hydrocodone acetaminophen  F/U PCP Dr. Vickki Muff for evaliation of  BP and general medical  condition   F/U surgical evaluation. May consider pending follow-up evaluations  Rheumatological evaluation. Will avoid at this time. Patient will continue the care of Dr. Vickki Muff in this regard as previously discussed  F/U neurological evaluation. May consider pending follow-up evaluations  May consider radiofrequency rhizolysis or intraspinal procedures pending response to present treatment and F/U evaluation . We will avoid these procedures at this time  Patient is to call pain management prior to scheduled return appointment should patient have concerns regarding condition

## 2015-11-24 ENCOUNTER — Encounter: Payer: 59 | Admitting: Pain Medicine

## 2015-12-22 ENCOUNTER — Ambulatory Visit: Payer: 59 | Attending: Pain Medicine | Admitting: Pain Medicine

## 2015-12-22 ENCOUNTER — Encounter: Payer: Self-pay | Admitting: Pain Medicine

## 2015-12-22 VITALS — BP 124/76 | HR 97 | Temp 98.4°F | Resp 18 | Ht 60.0 in | Wt 155.0 lb

## 2015-12-22 DIAGNOSIS — M461 Sacroiliitis, not elsewhere classified: Secondary | ICD-10-CM

## 2015-12-22 DIAGNOSIS — M546 Pain in thoracic spine: Secondary | ICD-10-CM | POA: Diagnosis present

## 2015-12-22 DIAGNOSIS — M533 Sacrococcygeal disorders, not elsewhere classified: Secondary | ICD-10-CM | POA: Insufficient documentation

## 2015-12-22 DIAGNOSIS — M5136 Other intervertebral disc degeneration, lumbar region: Secondary | ICD-10-CM | POA: Diagnosis not present

## 2015-12-22 DIAGNOSIS — M47818 Spondylosis without myelopathy or radiculopathy, sacral and sacrococcygeal region: Secondary | ICD-10-CM

## 2015-12-22 DIAGNOSIS — M19011 Primary osteoarthritis, right shoulder: Secondary | ICD-10-CM

## 2015-12-22 DIAGNOSIS — M51369 Other intervertebral disc degeneration, lumbar region without mention of lumbar back pain or lower extremity pain: Secondary | ICD-10-CM

## 2015-12-22 DIAGNOSIS — M79606 Pain in leg, unspecified: Secondary | ICD-10-CM | POA: Diagnosis present

## 2015-12-22 DIAGNOSIS — M19019 Primary osteoarthritis, unspecified shoulder: Secondary | ICD-10-CM | POA: Diagnosis not present

## 2015-12-22 DIAGNOSIS — M7062 Trochanteric bursitis, left hip: Secondary | ICD-10-CM

## 2015-12-22 DIAGNOSIS — M7060 Trochanteric bursitis, unspecified hip: Secondary | ICD-10-CM | POA: Diagnosis not present

## 2015-12-22 DIAGNOSIS — M19012 Primary osteoarthritis, left shoulder: Secondary | ICD-10-CM

## 2015-12-22 DIAGNOSIS — M7061 Trochanteric bursitis, right hip: Secondary | ICD-10-CM

## 2015-12-22 DIAGNOSIS — M542 Cervicalgia: Secondary | ICD-10-CM | POA: Diagnosis present

## 2015-12-22 MED ORDER — FENTANYL 25 MCG/HR TD PT72: MEDICATED_PATCH | TRANSDERMAL | Status: DC

## 2015-12-22 MED ORDER — HYDROCODONE-ACETAMINOPHEN 7.5-325 MG PO TABS: 1.0000 | ORAL_TABLET | Freq: Four times a day (QID) | ORAL | Status: DC | PRN

## 2015-12-22 NOTE — Progress Notes (Signed)
Safety precautions to be maintained throughout the outpatient stay will include: orient to surroundings, keep bed in low position, maintain call bell within reach at all times, provide assistance with transfer out of bed and ambulation.  

## 2015-12-22 NOTE — Progress Notes (Signed)
   Subjective:    Patient ID: Jordan Duran, female    DOB: 02/05/1956, 60 y.o.   MRN: [id number]  HPI  The patient is a 60 year old female who returns to pain management for further evaluation and treatment of pain involving the neck entire back upper and lower extremity region. The patient has undergone prior evaluation and with Dr. Kirkland Hun and has discuss rheumatological evaluation. The patient is with multiple arthralgias and myalgias and there is concern regarding underlying arthritic conditions contributing to patient's symptomatology of significant degree.. The patient denies any trauma change in events of daily living of significant degree. The patient is with significant pain involving involving the greater trochanteric region as well as pain of the hands knees and other joints. We will remain available to consider patient for interventional treatment as well as modification of treatment regimen pending follow-up evaluation. The patient is in agreement with suggested treatment plan. Patient will continue hydrocodone acetaminophen and fentanyl patches at this time and we will consider patient for modification of treatment regimen pending follow-up evaluation. All agreed to suggested treatment plan       Review of Systems     Objective:   Physical Exam  There was tenderness of the splenius capitis and occipitalis musculature region palpation which reproduces pain of moderate degree with moderate tenderness over the cervical facet cervical paraspinal musculature region. There was tenderness over the region of the thoracic facet thoracic paraspinal musculature region a moderate degree. The patient appeared to be with decreased grip strength. Nodules were noted at the distal interphalangeal joints of the hand without increased warmth or erythema noted. The patient was with decreased grip strength. Tinel and Phalen's maneuver were without reproduction of severe pain. Palpation of the  thoracic region reproduced pain of moderate degree with moderate muscle spasms of the lower thoracic region. Palpation over the lumbar paraspinal musculature region was with mild to moderate discomfort with lateral bending rotation extension and palpation of the lumbar facets reproducing moderate discomfort. There was tenderness of the PSIS and P IIS region a moderate degree with moderate severe tenderness of the greater trochanteric region and iliotibial band region. Straight leg raising was tolerated to 30 without a definite increased pain with dorsiflexion noted. DTRs were difficult to elicit patient had difficulty relaxing there was no sensory deficit or dermatomal distribution detected. There was negative clonus negative Homans and abdomen was nontender with no costovertebral tenderness noted.      Assessment & Plan:     Degenerative disc disease lumbar spine Lumbar facet syndrome  Greater trochanteric bursitis  Sacroiliac joint dysfunction  Degenerative joint disease of shoulder      PLAN   Continue present medications fentanyl patch and hydrocodone acetaminophen  F/U PCP Dr. Vickki Muff for evaliation of  BP and general medical  condition . Also discuss rheumatological and neurological evaluation with Dr. Vickki Muff  F/U surgical evaluation. May consider pending follow-up evaluations  Rheumatological evaluation. Will avoid at this time. Patient will continue under the care of Dr. Vickki Muff in this regard as previously discussed  F/U neurological evaluation. Discuss neurological evaluation with Dr. Vickki Muff as we discussed today  May consider radiofrequency rhizolysis or intraspinal procedures pending response to present treatment and F/U evaluation .

## 2015-12-22 NOTE — Patient Instructions (Signed)
PLAN   Continue present medications fentanyl patch and hydrocodone acetaminophen  F/U PCP Dr. Vickki Muff for evaliation of  BP and general medical  condition . Also discussed neurological evaluation with Dr. Vickki Muff  F/U surgical evaluation. May consider pending follow-up evaluations  Rheumatological evaluation. Will avoid at this time. Patient will continue the care of Dr. Vickki Muff in this regard as previously discussed  F/U neurological evaluation. Discuss neurological evaluation with Dr. Vickki Muff as we discussed today  May consider radiofrequency rhizolysis or intraspinal procedures pending response to present treatment and F/U evaluation . We will avoid these procedures at this time  Patient is to call pain management prior to scheduled return appointment should patient have concerns regarding condition

## 2016-01-19 ENCOUNTER — Encounter: Payer: Self-pay | Admitting: Pain Medicine

## 2016-01-19 ENCOUNTER — Ambulatory Visit: Payer: PRIVATE HEALTH INSURANCE | Attending: Pain Medicine | Admitting: Pain Medicine

## 2016-01-19 VITALS — BP 116/67 | HR 81 | Temp 98.2°F | Resp 16 | Ht 60.0 in | Wt 155.0 lb

## 2016-01-19 DIAGNOSIS — M542 Cervicalgia: Secondary | ICD-10-CM | POA: Diagnosis present

## 2016-01-19 DIAGNOSIS — M533 Sacrococcygeal disorders, not elsewhere classified: Secondary | ICD-10-CM | POA: Diagnosis not present

## 2016-01-19 DIAGNOSIS — M461 Sacroiliitis, not elsewhere classified: Secondary | ICD-10-CM

## 2016-01-19 DIAGNOSIS — M5136 Other intervertebral disc degeneration, lumbar region: Secondary | ICD-10-CM | POA: Diagnosis not present

## 2016-01-19 DIAGNOSIS — M79606 Pain in leg, unspecified: Secondary | ICD-10-CM | POA: Diagnosis present

## 2016-01-19 DIAGNOSIS — M19011 Primary osteoarthritis, right shoulder: Secondary | ICD-10-CM

## 2016-01-19 DIAGNOSIS — M7060 Trochanteric bursitis, unspecified hip: Secondary | ICD-10-CM | POA: Diagnosis not present

## 2016-01-19 DIAGNOSIS — M7710 Lateral epicondylitis, unspecified elbow: Secondary | ICD-10-CM | POA: Insufficient documentation

## 2016-01-19 DIAGNOSIS — M19012 Primary osteoarthritis, left shoulder: Secondary | ICD-10-CM

## 2016-01-19 DIAGNOSIS — M19019 Primary osteoarthritis, unspecified shoulder: Secondary | ICD-10-CM | POA: Insufficient documentation

## 2016-01-19 DIAGNOSIS — M51369 Other intervertebral disc degeneration, lumbar region without mention of lumbar back pain or lower extremity pain: Secondary | ICD-10-CM

## 2016-01-19 DIAGNOSIS — M546 Pain in thoracic spine: Secondary | ICD-10-CM | POA: Diagnosis present

## 2016-01-19 DIAGNOSIS — M47818 Spondylosis without myelopathy or radiculopathy, sacral and sacrococcygeal region: Secondary | ICD-10-CM

## 2016-01-19 DIAGNOSIS — M7061 Trochanteric bursitis, right hip: Secondary | ICD-10-CM

## 2016-01-19 DIAGNOSIS — M7062 Trochanteric bursitis, left hip: Secondary | ICD-10-CM

## 2016-01-19 MED ORDER — HYDROCODONE-ACETAMINOPHEN 7.5-325 MG PO TABS: 1.0000 | ORAL_TABLET | Freq: Four times a day (QID) | ORAL | Status: DC | PRN

## 2016-01-19 MED ORDER — FENTANYL 25 MCG/HR TD PT72: MEDICATED_PATCH | TRANSDERMAL | Status: DC

## 2016-01-19 NOTE — Progress Notes (Signed)
Safety precautions to be maintained throughout the outpatient stay will include: orient to surroundings, keep bed in low position, maintain call bell within reach at all times, provide assistance with transfer out of bed and ambulation. ,

## 2016-01-19 NOTE — Patient Instructions (Addendum)
PLAN   Continue present medications fentanyl patch and hydrocodone acetaminophen  F/U PCP Dr. Vickki Muff for evaliation of  BP rheumatological evaluation neurological evaluation and general medical  condition . Discussed Dr Trisha Mangle Will treat rheumatological condition at this time. As discussed we we will observe response to your wearing strap on the left upper extremity to treat tennis elbow and will delay neurological evaluation at this time . You should limit activity as we discussed to avoid aggravating the tennis elbow of the left upper extremity  F/U surgical evaluation. May consider pending follow-up evaluations  Rheumatological evaluation. Will avoid at this time. Patient will continue the care of Dr. Vickki Muff in this regard as previously discussed  F/U neurological evaluation As discussed we will observe response to treatment for tennis elbow at this time  May consider radiofrequency rhizolysis or intraspinal procedures pending response to present treatment and F/U evaluation . We will avoid these procedures at this time  Patient is to call pain management prior to scheduled return appointment should patient have concerns regarding condition

## 2016-01-19 NOTE — Progress Notes (Signed)
Subjective:    Patient ID: Jordan Duran, female    DOB: [birth year], 60 y.o.   MRN: [id number]  HPI  The patient is a 60 year old female who returns to pain management for further evaluation and treatment of pain involving the neck entire back upper and lower extremity region as well as the shoulders and hips and hands. On today's visit the patient had complained of pain involving the elbow. Evaluation revealed patient to be with evidence of lateral epicondylitis or tennis elbow. We discussed treatment of the tennis elbow and patient will apply strap to the area and we will consider interventional treatment pending response to patient wearing strap and decreasing activities involving the left upper extremity. The patient will continue the care of Dr. Kirkland Hun for treatment of her rheumatological and neurological symptoms and history of her general medical condition. The patient continues to tolerate Duragesic patch and is without need for hydrocodone acetaminophen or Ambien at this time. We remain available to inject the region of the left elbow should patient be with significant pain despite application of strap to the left upper extremity and will consider patient for additional treatment of other regions if necessary as discussed and as explained to patient on today's visit. All agreed to suggested treatment plan. The patient denied any trauma or other changes in events of daily living to cause change in her symptoms.  Review of Systems     Objective:   Physical Exam  There was tenderness of the splenius capitis and occipitalis musculature region a mild degree. There was mild tenderness of the cervical facet cervical paraspinal musculature region. Palpation of the acromioclavicular and glenohumeral joint regions reproduce mild discomfort. The patient was able to perform drop test without significant difficulty. There was moderate to moderately severe tenderness of the lateral epicondyles of  the left elbow. No increased warmth erythema was noted. There was slightly decreased grip strength on the left compared to the right and Tinel and Phalen's maneuver were without increase of pain of significant degree. Palpation over the thoracic region thoracic facet region was with no crepitus of the thoracic region noted. Lumbar paraspinal muscular treat and lumbar facet region was attends to palpation of mild degree with lateral bending rotation extension and palpation over the lumbar facets reproducing mild discomfort. Straight leg raising was tolerates approximately 30 without increase of pain with dorsiflexion noted. There was negative clonus negative Homans. Palpation over the PSIS and PII S region was with moderate tenderness to palpation. There was moderate to moderately severe tenderness of the greater trochanteric region and iliotibial band region. No sensory deficit or dermatomal distribution was detected. The abdomen was nontender with no costovertebral tenderness noted      Assessment & Plan:    Degenerative disc disease lumbar spine Lumbar facet syndrome  Greater trochanteric bursitis  Sacroiliac joint dysfunction  Degenerative joint disease of shoulder     PLAN   Continue present medications fentanyl patch and hydrocodone acetaminophen  F/U PCP Dr. Vickki Muff for evaliation of  BP rheumatological evaluation neurological evaluation and general medical  condition . Discussed Dr Trisha Mangle Will treat rheumatological condition at this time. As discussed we we will observe response to your wearing strap on the left upper extremity to treat tennis elbow and will delay neurological evaluation at this time . You should limit activity as we discussed to avoid aggravating the tennis elbow of the left upper extremity  F/U surgical evaluation. May consider pending follow-up evaluations  Rheumatological evaluation.  Will avoid at this time. Patient will continue the care of Dr. Vickki Muff in  this regard as previously discussed  F/U neurological evaluation As discussed we will observe response to treatment for tennis elbow at this time  May consider radiofrequency rhizolysis or intraspinal procedures pending response to present treatment and F/U evaluation . We will avoid these procedures at this time  Patient is to call pain management prior to scheduled return appointment should patient have concerns regarding condition

## 2016-02-06 ENCOUNTER — Ambulatory Visit: Admission: EM | Admit: 2016-02-06 | Discharge: 2016-02-06 | Discharge status: Absent without leave | Payer: 59

## 2016-02-07 ENCOUNTER — Other Ambulatory Visit: Payer: Self-pay | Admitting: Pain Medicine

## 2016-02-09 ENCOUNTER — Encounter: Payer: Self-pay | Admitting: *Deleted

## 2016-02-09 ENCOUNTER — Ambulatory Visit (INDEPENDENT_AMBULATORY_CARE_PROVIDER_SITE_OTHER): Payer: 59

## 2016-02-09 ENCOUNTER — Ambulatory Visit
Admission: EM | Admit: 2016-02-09 | Discharge: 2016-02-09 | Disposition: A | Discharge status: Home / Self Care | Payer: 59 | Attending: Family Medicine | Admitting: Family Medicine

## 2016-02-09 DIAGNOSIS — S2232XA Fracture of one rib, left side, initial encounter for closed fracture: Secondary | ICD-10-CM

## 2016-02-09 NOTE — ED Notes (Signed)
Pt states her neurologist suspects she has Parkinson's but she has not been dx yet. States she falls on average twice a week. On 10 April while working in the yard she bent over to work and fell landing on left rib region and left hand also injuring right knee. C/o left rib pain with resp/movement, and left hand pain with discoloration.

## 2016-02-09 NOTE — ED Provider Notes (Signed)
CSN: [id number]     Arrival date & time 02/09/16  1124 History   First MD Initiated Contact with Patient 02/09/16 1243     Chief Complaint  Patient presents with  . Rib Injury  . Hand Injury  . Knee Injury   (Consider location/radiation/quality/duration/timing/severity/associated sxs/prior Treatment) HPI This 60 year old female who states that she fell 3 days ago while she was working in the yard. She states that she was bent over and turned her head when she lost her balance and fell injuring her left ribs anteriorly under her left breast and also her left hand. She states that she's been diagnosed recently with possible Parkinson's and she falls on average about twice a week due to losing her balance. She states that her hand has improved since the fall and has only been bruised now. However her pain under her left breast is very painful with any movement or respirations. Temperature is 98.3 E5 respirations 16 blood pressure 144/70 O2 sat at 95% on room air.       Past Medical History  Diagnosis Date  . Heart murmur   . Depression   . Cancer ([place])     Melanoma of the Skin  . Fracture of T12 vertebra ([place])   . Fracture of L1 vertebra ([place])   . Recurrent UTI   . Arthritis   . Hyperlipidemia    Past Surgical History  Procedure Laterality Date  . Abdominal hysterectomy    . Cesarean section    . Tubal ligation    . Cholecystectomy    . Colonoscopy with propofol N/A 07/29/2015    Procedure: COLONOSCOPY WITH PROPOFOL;  Surgeon: Hulen Luster, MD;  [facility] ENDOSCOPY;  Service: Gastroenterology;  Laterality: N/A;   Family History  Problem Relation Age of Onset  . Arthritis Mother   . Depression Mother   . Diabetes Mother   . Early death Mother   . Varicose Veins Mother   . Heart disease Father   . Hypertension Father   . Arthritis Sister   . Depression Sister   . Drug abuse Sister   . Depression Son   . Diabetes Maternal Aunt   . Depression Maternal Aunt   .  Hypertension Maternal Grandmother   . Stroke Maternal Grandmother   . Varicose Veins Maternal Grandmother   . Diabetes Paternal Grandmother   . Arthritis Paternal Grandmother    Social History  Substance Use Topics  . Smoking status: Former Research scientist (life sciences)  . Smokeless tobacco: Former Systems developer    Quit date: 07/10/1974  . Alcohol Use: No   OB History    No data available     Review of Systems  Constitutional: Positive for activity change. Negative for fever, chills and fatigue.  Respiratory: Positive for shortness of breath.   Cardiovascular: Positive for chest pain.  All other systems reviewed and are negative.   Allergies  Sulfa antibiotics and Demerol  Home Medications   Prior to Admission medications   Medication Sig Start Date End Date Taking? Authorizing Provider  citalopram (CELEXA) 20 MG tablet Take 20 mg by mouth daily.   Yes Historical Provider, MD  fentaNYL (DURAGESIC - DOSED MCG/HR) 25 MCG/HR patch Apply 1 patch to skin every 2 days if tolerated 01/19/16  Yes Mohammed Kindle, MD  naproxen sodium (ANAPROX) 220 MG tablet Take 220 mg by mouth 3 (three) times daily with meals. Reported on 01/19/2016   Yes Historical Provider, MD  nortriptyline (PAMELOR) 25 MG capsule Take 25 mg  by mouth at bedtime.   Yes Historical Provider, MD  nitrofurantoin (MACRODANTIN) 100 MG capsule Take 100 mg by mouth 4 (four) times daily.    Historical Provider, MD   Meds Ordered and Administered this Visit  Medications - No data to display  BP 144/70 mmHg  Pulse 85  Temp(Src) 98.3 F (36.8 C) (Oral)  Resp 16  Ht 5' (1.524 m)  Wt 155 lb (70.308 kg)  BMI 30.27 kg/m2  SpO2 95% No data found.   Physical Exam  Nursing note and vitals reviewed.   ED Course  Procedures (including critical care time)  Labs Review Labs Reviewed - No data to display  Imaging Review Dg Ribs Unilateral W/chest Left  02/09/2016  CLINICAL DATA:  Acute left rib pain after fall on steps 3 days ago. EXAM: LEFT RIBS AND  CHEST - 3+ VIEW COMPARISON:  None. FINDINGS: Minimally displaced fracture seen involving the anterior portion of the left seventh rib. There is no evidence of pneumothorax or pleural effusion. Both lungs are clear. Heart size and mediastinal contours are within normal limits. IMPRESSION: Minimally displaced left seventh rib fracture. No acute cardiopulmonary abnormality seen. Electronically Signed   By: Marijo Conception, M.D.   On: 02/09/2016 13:38     Visual Acuity Review  Right Eye Distance:   Left Eye Distance:   Bilateral Distance:    Right Eye Near:   Left Eye Near:    Bilateral Near:         MDM   1. Left rib fracture, closed, initial encounter    Discharge Medication List as of 02/09/2016  2:02 PM    Plan: 1. Test/x-ray results and diagnosis reviewed with patient 2. rx as per orders; risks, benefits, potential side effects reviewed with patient 3. Recommend supportive treatment with Coughing and deep breathing on a regular basis. She is on a pain contract for Vicodin and told her that she may use that and to supplement with Advil or Aleve. Is also taking a fentanyl patch. Recommend she follow-up with her primary care physician if she has any problems or is not improving. 4. F/u prn if symptoms worsen or don't improve     Lorin Picket, PA-C 02/09/16 1408

## 2016-02-09 NOTE — Discharge Instructions (Signed)

## 2016-02-16 ENCOUNTER — Encounter: Payer: Self-pay | Admitting: Pain Medicine

## 2016-02-16 ENCOUNTER — Ambulatory Visit: Payer: PRIVATE HEALTH INSURANCE | Attending: Pain Medicine | Admitting: Pain Medicine

## 2016-02-16 VITALS — BP 135/87 | HR 89 | Temp 98.8°F | Resp 16 | Ht 60.0 in | Wt 155.0 lb

## 2016-02-16 DIAGNOSIS — M19012 Primary osteoarthritis, left shoulder: Secondary | ICD-10-CM

## 2016-02-16 DIAGNOSIS — M19019 Primary osteoarthritis, unspecified shoulder: Secondary | ICD-10-CM | POA: Diagnosis not present

## 2016-02-16 DIAGNOSIS — M533 Sacrococcygeal disorders, not elsewhere classified: Secondary | ICD-10-CM | POA: Diagnosis not present

## 2016-02-16 DIAGNOSIS — M7062 Trochanteric bursitis, left hip: Secondary | ICD-10-CM

## 2016-02-16 DIAGNOSIS — M7060 Trochanteric bursitis, unspecified hip: Secondary | ICD-10-CM | POA: Insufficient documentation

## 2016-02-16 DIAGNOSIS — M7061 Trochanteric bursitis, right hip: Secondary | ICD-10-CM

## 2016-02-16 DIAGNOSIS — M545 Low back pain: Secondary | ICD-10-CM | POA: Diagnosis present

## 2016-02-16 DIAGNOSIS — M19011 Primary osteoarthritis, right shoulder: Secondary | ICD-10-CM

## 2016-02-16 DIAGNOSIS — M5136 Other intervertebral disc degeneration, lumbar region: Secondary | ICD-10-CM | POA: Insufficient documentation

## 2016-02-16 DIAGNOSIS — M47818 Spondylosis without myelopathy or radiculopathy, sacral and sacrococcygeal region: Secondary | ICD-10-CM

## 2016-02-16 DIAGNOSIS — M461 Sacroiliitis, not elsewhere classified: Secondary | ICD-10-CM

## 2016-02-16 DIAGNOSIS — M7710 Lateral epicondylitis, unspecified elbow: Secondary | ICD-10-CM

## 2016-02-16 DIAGNOSIS — M79606 Pain in leg, unspecified: Secondary | ICD-10-CM | POA: Diagnosis present

## 2016-02-16 MED ORDER — FENTANYL 25 MCG/HR TD PT72: MEDICATED_PATCH | TRANSDERMAL | Status: DC

## 2016-02-16 MED ORDER — HYDROCODONE-ACETAMINOPHEN 7.5-325 MG PO TABS: ORAL_TABLET | ORAL | Status: DC

## 2016-02-16 NOTE — Progress Notes (Signed)
Safety precautions to be maintained throughout the outpatient stay will include: orient to surroundings, keep bed in low position, maintain call bell within reach at all times, provide assistance with transfer out of bed and ambulation.  

## 2016-02-16 NOTE — Progress Notes (Signed)
   Subjective:    Patient ID: Jordan Duran, female    DOB: 1955/12/21, 60 y.o.   MRN: [id number]  HPI  The patient is a 60 year old female who returns to pain management for further evaluation and treatment of pain involving the lower back lower extremity region with history of pain involving the shoulder greater trochanteric region with predominant pain at the present time involving the region of the elbow. The patient is a 38 appears to be component of tennis elbow which is aggravated by reaching and lifting pushing pulling maneuvers. The patient is without any known trauma change in events of daily living the call significant change in symptomatology. We'll proceed with injection of the elbow at time return appointment and will continue medications fentanyl patch with hydrocodone acetaminophen for breakthrough pain as prescribed  Review of Systems     Objective:   Physical Exam  There was tenderness to palpation of the paraspinal musculature region of the cervical region cervical facet region a mild degree with mild tenderness of the splenius capitate and occipitalis musculature region palpation of the acromioclavicular and glenohumeral joint region there was tenderness to palpation of moderate degree and I'll of the elbow reproduced severe pain with no increased warmth and erythema in the region of the epicondyles. Grip strength appeared to be decreased with nodules noted at the distal interphalangeal joint regions there was tenderness to palpation of the joints of the hand with no increased warmth or erythema noted. Palpation over the region of the thoracic facet thoracic paraspinal musculature region as well as tenderness to palpation with no crepitus of the thoracic region with palpation over the lumbar facet lumbar paraspinal musculature region reproducing pain of moderate degree with extension and palpation of the lumbar facets reproducing moderate discomfort. Palpation of the greater  trochanteric region iliotibial band region was with moderate tenderness to palpation with straight leg raise was tolerates approximately 30 without increased pain with dorsiflexion noted. There was negative clonus negative Homans The abdomen was nontender with no costovertebral tenderness noted      Assessment & Plan:      Degenerative disc disease lumbar spine Lumbar facet syndrome  Greater trochanteric bursitis  Sacroiliac joint dysfunction  Degenerative joint disease of shoulder    PLAN   Continue present medications fentanyl patch and hydrocodone acetaminophe  Elbow injection to be performed at time of return appointment  F/U PCP Dr. Vickki Muff for evaliation of  BP and general medical  condition . Also discuss neurological evaluation with Dr. Vickki Muff as previously mentioned  F/U surgical evaluation. May consider pending follow-up evaluations  Rheumatological evaluation. Will avoid at this time. Patient will continue the care of Dr. Vickki Muff in this regard as previously discussed  F/U neurological evaluation. Discuss neurological evaluation with Dr. Vickki Muff as we discussed today  May consider radiofrequency rhizolysis or intraspinal procedures pending response to present treatment and F/U evaluation . We will avoid these procedures at this time  Patient is to call pain management prior to scheduled return appointment should patient have concern regarding conditionT

## 2016-02-16 NOTE — Patient Instructions (Addendum)
PLAN   Continue present medications fentanyl patch and hydrocodone acetaminophe  Elbow injection to be performed at time of return appointment  F/U PCP Dr. Vickki Muff for evaliation of  BP and general medical  condition . Also discuss neurological evaluation with Dr. Vickki Muff as previously mentioned  F/U surgical evaluation. May consider pending follow-up evaluations  Rheumatological evaluation. Will avoid at this time. Patient will continue the care of Dr. Vickki Muff in this regard as previously discussed  F/U neurological evaluation. Discuss neurological evaluation with Dr. Vickki Muff as we discussed today  May consider radiofrequency rhizolysis or intraspinal procedures pending response to present treatment and F/U evaluation . We will avoid these procedures at this time  Patient is to call pain management prior to scheduled return appointment should patient have concerns regarding conditionTennis Elbow Tennis elbow (lateral epicondylitis) is inflammation of the outer tendons of your forearm close to your elbow. Your tendons attach your muscles to your bones. The outer tendons of your forearm are used to extend your wrist, and they attach on the outside part of your elbow. Tennis elbow is often found in people who play tennis, but anyone may get the condition from repeatedly extending the wrist or turning the forearm. CAUSES This condition is caused by repeatedly extending your wrist and using your hands. It can result from sports or work that requires repetitive forearm movements. Tennis elbow may also be caused by an injury. RISK FACTORS You have a higher risk of developing tennis elbow if you play tennis or another racquet sport. You also have a higher risk if you frequently use your hands for work. This condition is also more likely to develop in:  Musicians.  Carpenters, painters, and plumbers.  Cooks.  Cashiers.  People who work in [employer].  Architect workers.  Butchers.  People  who use computers. SYMPTOMS Symptoms of this condition include:  Pain and tenderness in your forearm and the outer part of your elbow. You may only feel the pain when you use your arm, or you may feel it even when you are not using your arm.  A burning feeling that runs from your elbow through your arm.  Weak grip in your hands. DIAGNOSIS  This condition may be diagnosed by medical history and physical exam. You may also have other tests, including:  X-rays.  MRI. TREATMENT Your health care provider will recommend lifestyle adjustments, such as resting and icing your arm. Treatment may also include:  Medicines for inflammation. This may include shots of cortisone if your pain continues.  Physical therapy. This may include massage or exercises.  An elbow brace. Surgery may eventually be recommended if your pain does not go away with treatment. HOME CARE INSTRUCTIONS Activity  Rest your elbow and wrist as directed by your health care provider. Try to avoid any activities that caused the problem until your health care provider says that you can do them again.  If a physical therapist teaches you exercises, do all of them as directed.  If you lift an object, lift it with your palm facing upward. This lowers the stress on your elbow. Lifestyle  If your tennis elbow is caused by sports, check your equipment and make sure that:  You are using it correctly.  It is the best fit for you.  If your tennis elbow is caused by work, take breaks frequently, if you are able. Talk with your manager about how to best perform tasks in a way that is safe.  If your tennis  elbow is caused by computer use, talk with your manager about any changes that can be made to your work environment. General Instructions  If directed, apply ice to the painful area:  Put ice in a plastic bag.  Place a towel between your skin and the bag.  Leave the ice on for 20 minutes, 2-3 times per day.  Take  medicines only as directed by your health care provider.  If you were given a brace, wear it as directed by your health care provider.  Keep all follow-up visits as directed by your health care provider. This is important. SEEK MEDICAL CARE IF:  Your pain does not get better with treatment.  Your pain gets worse.  You have numbness or weakness in your forearm, hand, or fingers.   This information is not intended to replace advice given to you by your health care provider. Make sure you discuss any questions you have with your health care provider.   Document Released: 10/15/2005 Document Revised: 03/01/2015 Document Reviewed: 10/11/2014 Elsevier Interactive Patient Education 2016 [employer]  What are the risk, side effects and possible complications? Generally speaking, most procedures are safe.  However, with any procedure there are risks, side effects, and the possibility of complications.  The risks and complications are dependent upon the sites that are lesioned, or the type of nerve block to be performed.  The closer the procedure is to the spine, the more serious the risks are.  Great care is taken when placing the radio frequency needles, block needles or lesioning probes, but sometimes complications can occur.  Infection: Any time there is an injection through the skin, there is a risk of infection.  This is why sterile conditions are used for these blocks.  There are four possible types of infection.  Localized skin infection.  Central Nervous System Infection-This can be in the form of Meningitis, which can be deadly.  Epidural Infections-This can be in the form of an epidural abscess, which can cause pressure inside of the spine, causing compression of the spinal cord with subsequent paralysis. This would require an emergency surgery to decompress, and there are no guarantees that the patient would recover from the paralysis.  Discitis-This  is an infection of the intervertebral discs.  It occurs in about 1% of discography procedures.  It is difficult to treat and it may lead to surgery.        2. Pain: the needles have to go through skin and soft tissues, will cause soreness.       3. Damage to internal structures:  The nerves to be lesioned may be near blood vessels or    other nerves which can be potentially damaged.       4. Bleeding: Bleeding is more common if the patient is taking blood thinners such as  aspirin, Coumadin, Ticiid, Plavix, etc., or if he/she have some genetic predisposition  such as hemophilia. Bleeding into the spinal canal can cause compression of the spinal  cord with subsequent paralysis.  This would require an emergency surgery to  decompress and there are no guarantees that the patient would recover from the  paralysis.       5. Pneumothorax:  Puncturing of a lung is a possibility, every time a needle is introduced in  the area of the chest or upper back.  Pneumothorax refers to free air around the  collapsed lung(s), inside of the thoracic cavity (chest cavity).  Another two possible  complications related to a similar event would include: Hemothorax and Chylothorax.   These are variations of the Pneumothorax, where instead of air around the collapsed  lung(s), you may have blood or chyle, respectively.       6. Spinal headaches: They may occur with any procedures in the area of the spine.       7. Persistent CSF (Cerebro-Spinal Fluid) leakage: This is a rare problem, but may occur  with prolonged intrathecal or epidural catheters either due to the formation of a fistulous  track or a dural tear.       8. Nerve damage: By working so close to the spinal cord, there is always a possibility of  nerve damage, which could be as serious as a permanent spinal cord injury with  paralysis.       9. Death:  Although rare, severe deadly allergic reactions known as "Anaphylactic  reaction" can occur to any of the medications  used.      10. Worsening of the symptoms:  We can always make thing worse.  What are the chances of something like this happening? Chances of any of this occuring are extremely low.  By statistics, you have more of a chance of getting killed in a motor vehicle accident: while driving to the hospital than any of the above occurring .  Nevertheless, you should be aware that they are possibilities.  In general, it is similar to taking a shower.  Everybody knows that you can slip, hit your head and get killed.  Does that mean that you should not shower again?  Nevertheless always keep in mind that statistics do not mean anything if you happen to be on the wrong side of them.  Even if a procedure has a 1 (one) in a 1,000,000 (million) chance of going wrong, it you happen to be that one..Also, keep in mind that by statistics, you have more of a chance of having something go wrong when taking medications.  Who should not have this procedure? If you are on a blood thinning medication (e.g. Coumadin, Plavix, see list of "Blood Thinners"), or if you have an active infection going on, you should not have the procedure.  If you are taking any blood thinners, please inform your physician.  How should I prepare for this procedure?  Do not eat or drink anything at least six hours prior to the procedure.  Bring a driver with you .  It cannot be a taxi.  Come accompanied by an adult that can drive you back, and that is strong enough to help you if your legs get weak or numb from the local anesthetic.  Take all of your medicines the morning of the procedure with just enough water to swallow them.  If you have diabetes, make sure that you are scheduled to have your procedure done first thing in the morning, whenever possible.  If you have diabetes, take only half of your insulin dose and notify our nurse that you have done so as soon as you arrive at the clinic.  If you are diabetic, but only take blood sugar  pills (oral hypoglycemic), then do not take them on the morning of your procedure.  You may take them after you have had the procedure.  Do not take aspirin or any aspirin-containing medications, at least eleven (11) days prior to the procedure.  They may prolong bleeding.  Wear loose fitting clothing that may be easy to take off and that  you would not mind if it got stained with Betadine or blood.  Do not wear any jewelry or perfume  Remove any nail coloring.  It will interfere with some of our monitoring equipment.  NOTE: Remember that this is not meant to be interpreted as a complete list of all possible complications.  Unforeseen problems may occur.  BLOOD THINNERS The following drugs contain aspirin or other products, which can cause increased bleeding during surgery and should not be taken for 2 weeks prior to and 1 week after surgery.  If you should need take something for relief of minor pain, you may take acetaminophen which is found in Tylenol,m Datril, Anacin-3 and Panadol. It is not blood thinner. The products listed below are.  Do not take any of the products listed below in addition to any listed on your instruction sheet.  A.P.C or A.P.C with Codeine Codeine Phosphate Capsules #3 Ibuprofen Ridaura  ABC compound Congesprin Imuran rimadil  Advil Cope Indocin Robaxisal  Alka-Seltzer Effervescent Pain Reliever and Antacid Coricidin or Coricidin-D  Indomethacin Rufen  Alka-Seltzer plus Cold Medicine Cosprin Ketoprofen S-A-C Tablets  Anacin Analgesic Tablets or Capsules Coumadin Korlgesic Salflex  Anacin Extra Strength Analgesic tablets or capsules CP-2 Tablets Lanoril Salicylate  Anaprox Cuprimine Capsules Levenox Salocol  Anexsia-D Dalteparin Magan Salsalate  Anodynos Darvon compound Magnesium Salicylate Sine-off  Ansaid Dasin Capsules Magsal Sodium Salicylate  Anturane Depen Capsules Marnal Soma  APF Arthritis pain formula Dewitt's Pills Measurin Stanback  Argesic Dia-Gesic  Meclofenamic Sulfinpyrazone  Arthritis Bayer Timed Release Aspirin Diclofenac Meclomen Sulindac  Arthritis pain formula Anacin Dicumarol Medipren Supac  Analgesic (Safety coated) Arthralgen Diffunasal Mefanamic Suprofen  Arthritis Strength Bufferin Dihydrocodeine Mepro Compound Suprol  Arthropan liquid Dopirydamole Methcarbomol with Aspirin Synalgos  ASA tablets/Enseals Disalcid Micrainin Tagament  Ascriptin Doan's Midol Talwin  Ascriptin A/D Dolene Mobidin Tanderil  Ascriptin Extra Strength Dolobid Moblgesic Ticlid  Ascriptin with Codeine Doloprin or Doloprin with Codeine Momentum Tolectin  Asperbuf Duoprin Mono-gesic Trendar  Aspergum Duradyne Motrin or Motrin IB Triminicin  Aspirin plain, buffered or enteric coated Durasal Myochrisine Trigesic  Aspirin Suppositories Easprin Nalfon Trillsate  Aspirin with Codeine Ecotrin Regular or Extra Strength Naprosyn Uracel  Atromid-S Efficin Naproxen Ursinus  Auranofin Capsules Elmiron Neocylate Vanquish  Axotal Emagrin Norgesic Verin  Azathioprine Empirin or Empirin with Codeine Normiflo Vitamin E  Azolid Emprazil Nuprin Voltaren  Bayer Aspirin plain, buffered or children's or timed BC Tablets or powders Encaprin Orgaran Warfarin Sodium  Buff-a-Comp Enoxaparin Orudis Zorpin  Buff-a-Comp with Codeine Equegesic Os-Cal-Gesic   Buffaprin Excedrin plain, buffered or Extra Strength Oxalid   Bufferin Arthritis Strength Feldene Oxphenbutazone   Bufferin plain or Extra Strength Feldene Capsules Oxycodone with Aspirin   Bufferin with Codeine Fenoprofen Fenoprofen Pabalate or Pabalate-SF   Buffets II Flogesic Panagesic   Buffinol plain or Extra Strength Florinal or Florinal with Codeine Panwarfarin   Buf-Tabs Flurbiprofen Penicillamine   Butalbital Compound Four-way cold tablets Penicillin   Butazolidin Fragmin Pepto-Bismol   Carbenicillin Geminisyn Percodan   Carna Arthritis Reliever Geopen Persantine   Carprofen Gold's salt Persistin    Chloramphenicol Goody's Phenylbutazone   Chloromycetin Haltrain Piroxlcam   Clmetidine heparin Plaquenil   Cllnoril Hyco-pap Ponstel   Clofibrate Hydroxy chloroquine Propoxyphen         Before stopping any of these medications, be sure to consult the physician who ordered them.  Some, such as Coumadin (Warfarin) are ordered to prevent or treat serious conditions such as "deep thrombosis", "pumonary embolisms", and other heart  problems.  The amount of time that you may need off of the medication may also vary with the medication and the reason for which you were taking it.  If you are taking any of these medications, please make sure you notify your pain physician before you undergo any procedures.

## 2016-02-22 ENCOUNTER — Ambulatory Visit: Payer: 59 | Admitting: Pain Medicine

## 2016-03-15 ENCOUNTER — Encounter: Payer: Self-pay | Admitting: Pain Medicine

## 2016-03-15 ENCOUNTER — Ambulatory Visit: Payer: 59 | Attending: Pain Medicine | Admitting: Pain Medicine

## 2016-03-15 VITALS — BP 112/66 | HR 87 | Temp 98.1°F | Resp 16 | Wt 152.0 lb

## 2016-03-15 DIAGNOSIS — M1990 Unspecified osteoarthritis, unspecified site: Secondary | ICD-10-CM | POA: Diagnosis not present

## 2016-03-15 DIAGNOSIS — M7062 Trochanteric bursitis, left hip: Secondary | ICD-10-CM

## 2016-03-15 DIAGNOSIS — M7061 Trochanteric bursitis, right hip: Secondary | ICD-10-CM

## 2016-03-15 DIAGNOSIS — M5136 Other intervertebral disc degeneration, lumbar region: Secondary | ICD-10-CM

## 2016-03-15 DIAGNOSIS — M79602 Pain in left arm: Secondary | ICD-10-CM | POA: Diagnosis present

## 2016-03-15 DIAGNOSIS — M47818 Spondylosis without myelopathy or radiculopathy, sacral and sacrococcygeal region: Secondary | ICD-10-CM

## 2016-03-15 DIAGNOSIS — M19012 Primary osteoarthritis, left shoulder: Secondary | ICD-10-CM

## 2016-03-15 DIAGNOSIS — M461 Sacroiliitis, not elsewhere classified: Secondary | ICD-10-CM

## 2016-03-15 DIAGNOSIS — M19011 Primary osteoarthritis, right shoulder: Secondary | ICD-10-CM

## 2016-03-15 DIAGNOSIS — M19019 Primary osteoarthritis, unspecified shoulder: Secondary | ICD-10-CM | POA: Diagnosis not present

## 2016-03-15 DIAGNOSIS — M7060 Trochanteric bursitis, unspecified hip: Secondary | ICD-10-CM | POA: Insufficient documentation

## 2016-03-15 DIAGNOSIS — M7710 Lateral epicondylitis, unspecified elbow: Secondary | ICD-10-CM

## 2016-03-15 DIAGNOSIS — M533 Sacrococcygeal disorders, not elsewhere classified: Secondary | ICD-10-CM | POA: Insufficient documentation

## 2016-03-15 MED ORDER — FENTANYL 25 MCG/HR TD PT72: MEDICATED_PATCH | TRANSDERMAL | Status: DC

## 2016-03-15 NOTE — Progress Notes (Signed)
Safety precautions to be maintained throughout the outpatient stay will include: orient to surroundings, keep bed in low position, maintain call bell within reach at all times, provide assistance with transfer out of bed and ambulation.  

## 2016-03-15 NOTE — Patient Instructions (Signed)
PLAN   Continue present medications fentanyl patch and hydrocodone acetaminophen  F/U PCP Dr. Vickki Muff for evaliation of  BP and general medical  condition . Also discuss neurological evaluation with Dr. Vickki Muff as previously mentioned  F/U surgical evaluation. May consider pending follow-up evaluations  Rheumatological evaluation. Will avoid at this time. Patient will continue the care of Dr. Vickki Muff in this regard as previously discussed  F/U neurological evaluation. Discuss neurological evaluation with Dr. Vickki Muff as we discussed today  May consider radiofrequency rhizolysis or intraspinal procedures pending response to present treatment and F/U evaluation . We will avoid these procedures at this time  Patient is to call pain management prior to scheduled return appointment should patient have concerns regarding condition

## 2016-03-15 NOTE — Progress Notes (Signed)
Subjective:    Patient ID: Jordan Duran, female    DOB: Dec 24, 1955, 60 y.o.   MRN: [id number]  HPI  The patient is a 60 year old female pain management for further evaluation and treatment of pain involving the upper extremity region especially the region of the left upper extremity in the region of the elbow. The patient has had improvement of his been felt to be tennis elbow and is wearing strap of the left upper extremity with some improvement of her symptoms. We will caution patient regarding carrying heavy objects as well as consider interventional treatment for treatment of the tennis elbow of the left ankle. The patient continues medications consisting of fentanyl patch and without need for hydrocodone acetaminophen for breakthrough pain is without need for Ambien at this time as well. We remain available to consider patient for interventional treatment as well as modification of medications and other expressive treatment regimen pending follow-up evaluation. All agreed to suggested treatment plan. Patient will follow-up with Dr. Jocelyn Lamer, of the physician regarding her arthritis. Patient stated that Dr. Jocelyn Lamer, will be moving and that she will need to inquire the physician. We will remain available to consider modification of treatment pending follow-up evaluation. All agreed chest treatment plan.      Review of Systems     Objective:   Physical Exam  There was tenderness to palpation of paraspinal misreading the cervical and cervical facet region palpation which reproduces pain of mild degree with mild tenderness of the splenius capitis and talus region. Palpation of the acromial clavicular and glenohumeral joint region was without increased pain of significant degree. The patient appeared to be with unremarkable Spurling's maneuver. The patient appeared to be with decreased grip strength on the left compared to the right with tenderness to palpation of the lateral epicondyles of the left  elbow. There was no increased warmth and erythema in the region of the elbow. There was nodule formation of the ranges of the hand with no increased warmth erythema of the hands noted. Tinel and Phalen's maneuver were without increased pain of significant degree outpatient over the thoracic region was attends to palpation with no crepitus of the thoracic region noted. Palpation over the lumbar paraspinal must reason lumbar facet region was attends to palpation with lateral bending rotation extension and palpation of the lumbar facets reproducing mild discomfort. There was mild tenderness of the PSIS and PII S regions as well as with mild tenderness of the greater trochanteric region iliotibial band region. Straight leg raise was tolerated to 30 without increased pain with dorsiflexion noted. DTRs were trace at the knees. No sensory deficit dermatomal dystrophy detected. There was negative clonus negative Homans. Abdomen nontender with no costovertebral tenderness noted      Assessment & Plan:     Degenerative disc disease lumbar spine Lumbar facet syndrome  Greater trochanteric bursitis  Sacroiliac joint dysfunction  Degenerative joint disease of shoulder  Arthritis    PLAN   Continue present medications fentanyl patch and hydrocodone acetaminophen  F/U PCP Dr. Vickki Muff for evaliation of  BP and general medical  condition . Also discuss neurological evaluation with Dr. Vickki Muff as previously mentioned. Patient stated that she will need to look for another physician sets her physician will be moving  F/U surgical evaluation. May consider pending follow-up evaluations  Rheumatological evaluation. Will avoid at this time. Patient will continue the care of Dr. Vickki Muff in this regard as previously discussed  F/U neurological evaluation. Discuss neurological evaluation with Dr.  Vickki Muff as we discussed today  May consider radiofrequency rhizolysis or intraspinal procedures pending response to  present treatment and F/U evaluation . We will avoid these procedures at this time  Patient is to call pain management prior to scheduled return appointment should patient have concerns regarding condition

## 2016-04-12 ENCOUNTER — Ambulatory Visit: Payer: PRIVATE HEALTH INSURANCE | Admitting: Pain Medicine

## 2016-04-16 ENCOUNTER — Encounter: Payer: Self-pay | Admitting: Pain Medicine

## 2016-04-16 ENCOUNTER — Ambulatory Visit: Payer: PRIVATE HEALTH INSURANCE | Attending: Pain Medicine | Admitting: Pain Medicine

## 2016-04-16 VITALS — BP 152/72 | HR 84 | Temp 98.7°F | Resp 16 | Ht 60.0 in | Wt 153.0 lb

## 2016-04-16 DIAGNOSIS — M7060 Trochanteric bursitis, unspecified hip: Secondary | ICD-10-CM | POA: Diagnosis not present

## 2016-04-16 DIAGNOSIS — M5136 Other intervertebral disc degeneration, lumbar region: Secondary | ICD-10-CM | POA: Insufficient documentation

## 2016-04-16 DIAGNOSIS — M19012 Primary osteoarthritis, left shoulder: Secondary | ICD-10-CM

## 2016-04-16 DIAGNOSIS — M7062 Trochanteric bursitis, left hip: Secondary | ICD-10-CM

## 2016-04-16 DIAGNOSIS — M19019 Primary osteoarthritis, unspecified shoulder: Secondary | ICD-10-CM | POA: Diagnosis not present

## 2016-04-16 DIAGNOSIS — M62830 Muscle spasm of back: Secondary | ICD-10-CM | POA: Diagnosis not present

## 2016-04-16 DIAGNOSIS — M7061 Trochanteric bursitis, right hip: Secondary | ICD-10-CM

## 2016-04-16 DIAGNOSIS — M19011 Primary osteoarthritis, right shoulder: Secondary | ICD-10-CM

## 2016-04-16 DIAGNOSIS — M47818 Spondylosis without myelopathy or radiculopathy, sacral and sacrococcygeal region: Secondary | ICD-10-CM

## 2016-04-16 DIAGNOSIS — M533 Sacrococcygeal disorders, not elsewhere classified: Secondary | ICD-10-CM | POA: Insufficient documentation

## 2016-04-16 DIAGNOSIS — M1990 Unspecified osteoarthritis, unspecified site: Secondary | ICD-10-CM | POA: Insufficient documentation

## 2016-04-16 DIAGNOSIS — M7710 Lateral epicondylitis, unspecified elbow: Secondary | ICD-10-CM

## 2016-04-16 DIAGNOSIS — M545 Low back pain: Secondary | ICD-10-CM | POA: Diagnosis present

## 2016-04-16 DIAGNOSIS — M461 Sacroiliitis, not elsewhere classified: Secondary | ICD-10-CM

## 2016-04-16 DIAGNOSIS — M51369 Other intervertebral disc degeneration, lumbar region without mention of lumbar back pain or lower extremity pain: Secondary | ICD-10-CM

## 2016-04-16 MED ORDER — FENTANYL 25 MCG/HR TD PT72: MEDICATED_PATCH | TRANSDERMAL | Status: DC

## 2016-04-16 NOTE — Progress Notes (Signed)
Subjective:    Patient ID: Jordan Duran, female    DOB: October 05, 1956, 60 y.o.   MRN: [id number]  HPI  . The patient is a 60 year old female who returns to pain management for further evaluation and treatment of pain involving the lower back lower extremity region predominantly the patient also has a history of pain involving the region of the shoulder and has severe pain involving the region of the right shoulder status post recent fall. Patient has significantly limited range of motion of the shoulder as well. We have discussed patient's condition and will obtain MRI of the right shoulder. We will also discussed surgical evaluation and will consider patient for interventional treatment as well as additional modifications of treatment pending results of studies and follow-up evaluation. The patient denied any other trauma change in events of daily living the call significant change in symptomatology. The patient continues her fentanyl patch and a separate pain fairly well-controlled until the recent fall injuring the right shoulder. We will modify treatment regimen pending results of MRI of the right shoulder and follow-up evaluation the patient and will consider surgical evaluation as discussed and as well with the patient on today's visit. The patient prefer to delay surgical intervention at the present time. All agreed to suggested treatment plan  Review of Systems     Objective:   Physical Exam   There was tenderness of the splenius capitis and occipitalis region a mild degree. Mild tenderness of the cervical facet cervical paraspinal musculatures and was noted. There was severe tenderness of the acromioclavicular and glenohumeral joint region on the right with limited range of motion of the right shoulder to severe degree. There appeared to be decreased grip strength on the right compared to the left with Tinel and Phalen's maneuver reproducing minimal discomfort. There was tends to palpation of  the digits of the hand of mild degree with evidence of nodule formation without increased warmth or erythema noted Palpation over the thoracic region thoracic facet region was attends to palpation evidence of muscle spasm of the thoracic region without crepitus of the thoracic region noted. Palpation over the lumbar paraspinal musculatures and lumbar facet region was with mild tenderness to palpation with lateral bending rotation extension and palpation of the lumbar facets reproducing mild to moderate discomfort with mild tenderness of the greater trochanteric region iliotibial band region. There was straight leg raising tolerate to 30 without increased pain with dorsiflexion noted. Palpation over the PSIS and PII S region reproduced mild discomfort to moderate discomfort and mild tenderness of the greater trochanteric region iliotibial band region was noted. There was evidence of tenderness to palpation of the knees of mild to moderate degree.There appeared to be negative clonus negative Homans. Abdomen nontender with no costovertebral tenderness noted    Assessment & Plan:    Degenerative joint disease of shoulder  (status post recent trauma of shoulder)  Degenerative disc disease lumbar spine Lumbar facet syndrome  Greater trochanteric bursitis  Sacroiliac joint dysfunction  Arthritis     PLAN  Continue present medications fentanyl patch and hydrocodone acetaminophen  F/U PCP Dr. Vickki Muff for evaliation of  BP and general medical  condition . Also discuss neurological evaluation with Dr. Vickki Muff as previously mentioned  F/U surgical evaluation. May consider pending follow-up evaluations  Rheumatological evaluation. Will avoid at this time. Patient will continue the care of Dr. Vickki Muff in this regard as previously discussed  Ask the nurses and secretary the date of your MRI of the  right shoulder  F/U neurological evaluation. Discuss neurological evaluation with Dr. Vickki Muff as we  previously discussed  May consider radiofrequency rhizolysis or intraspinal procedures pending response to present treatment and F/U evaluation . We will avoid these procedures at this time  Patient is to call pain management prior to scheduled return appointment should patient have concerns regarding condition

## 2016-04-16 NOTE — Patient Instructions (Addendum)
PLAN   Continue present medications fentanyl patch and hydrocodone acetaminophen  F/U PCP Dr. Vickki Muff for evaliation of  BP and general medical  condition . Also discuss neurological evaluation with Dr. Vickki Muff as previously mentioned  F/U surgical evaluation. May consider pending follow-up evaluations  Rheumatological evaluation. Will avoid at this time. Patient will continue the care of Dr. Vickki Muff in this regard as previously discussed  Ask the nurses and secretary the date of your MRI of the right shoulder  F/U neurological evaluation. Discuss neurological evaluation with Dr. Vickki Muff as we previously discussed  May consider radiofrequency rhizolysis or intraspinal procedures pending response to present treatment and F/U evaluation . We will avoid these procedures at this time  Patient is to call pain management prior to scheduled return appointment should patient have concerns regarding condition

## 2016-04-16 NOTE — Progress Notes (Signed)
[place] on March 31, 2016 between a [place] injured the right shoulder.

## 2016-05-08 ENCOUNTER — Telehealth: Payer: Self-pay | Admitting: Pain Medicine

## 2016-05-08 ENCOUNTER — Telehealth: Payer: Self-pay | Admitting: *Deleted

## 2016-05-08 NOTE — Telephone Encounter (Signed)
Patient states shoulder is doing better and she wants to cancel MRI appt. She will be on vacation that week as well

## 2016-05-08 NOTE — Telephone Encounter (Signed)
Nurses and Secretaries Patient wishes to cancel MRI of shoulder This is okay with me Please cancel MRI of shoulder  Thank you

## 2016-05-14 ENCOUNTER — Ambulatory Visit: Payer: PRIVATE HEALTH INSURANCE | Attending: Pain Medicine | Admitting: Pain Medicine

## 2016-05-14 ENCOUNTER — Encounter: Payer: Self-pay | Admitting: Pain Medicine

## 2016-05-14 VITALS — BP 138/82 | HR 80 | Temp 98.0°F | Resp 16 | Ht 60.0 in | Wt 152.0 lb

## 2016-05-14 DIAGNOSIS — M5136 Other intervertebral disc degeneration, lumbar region: Secondary | ICD-10-CM | POA: Diagnosis not present

## 2016-05-14 DIAGNOSIS — M19019 Primary osteoarthritis, unspecified shoulder: Secondary | ICD-10-CM | POA: Insufficient documentation

## 2016-05-14 DIAGNOSIS — M7060 Trochanteric bursitis, unspecified hip: Secondary | ICD-10-CM | POA: Insufficient documentation

## 2016-05-14 DIAGNOSIS — M7710 Lateral epicondylitis, unspecified elbow: Secondary | ICD-10-CM

## 2016-05-14 DIAGNOSIS — M461 Sacroiliitis, not elsewhere classified: Secondary | ICD-10-CM

## 2016-05-14 DIAGNOSIS — M542 Cervicalgia: Secondary | ICD-10-CM | POA: Diagnosis present

## 2016-05-14 DIAGNOSIS — M7062 Trochanteric bursitis, left hip: Secondary | ICD-10-CM

## 2016-05-14 DIAGNOSIS — M19012 Primary osteoarthritis, left shoulder: Secondary | ICD-10-CM

## 2016-05-14 DIAGNOSIS — M533 Sacrococcygeal disorders, not elsewhere classified: Secondary | ICD-10-CM | POA: Diagnosis not present

## 2016-05-14 DIAGNOSIS — M7061 Trochanteric bursitis, right hip: Secondary | ICD-10-CM

## 2016-05-14 DIAGNOSIS — M19011 Primary osteoarthritis, right shoulder: Secondary | ICD-10-CM

## 2016-05-14 DIAGNOSIS — M47818 Spondylosis without myelopathy or radiculopathy, sacral and sacrococcygeal region: Secondary | ICD-10-CM

## 2016-05-14 MED ORDER — HYDROCODONE-ACETAMINOPHEN 7.5-325 MG PO TABS: ORAL_TABLET | ORAL | Status: DC

## 2016-05-14 MED ORDER — FENTANYL 25 MCG/HR TD PT72: MEDICATED_PATCH | TRANSDERMAL | Status: DC

## 2016-05-14 NOTE — Patient Instructions (Addendum)
PLAN   Continue present medications fentanyl patch and hydrocodone acetaminophen  F/U PCP Dr. Vickki Muff for evaliation of  BP and general medical  condition . Also discuss neurological evaluation with Dr. Vickki Muff as previously mentioned  F/U surgical evaluation. May consider pending follow-up evaluations. We will avoid surgical evaluation at this time  Rheumatological evaluation. Will avoid at this time. Patient will continue the care of Dr. Vickki Muff in this regard as previously discussed  F/U neurological evaluation. Patient has neurological evaluation scheduled  May consider radiofrequency rhizolysis or intraspinal procedures pending response to present treatment and F/U evaluation . We will avoid these procedures at this time  Patient is to call pain management prior to scheduled return appointment should patient have concerns regarding condition

## 2016-05-14 NOTE — Progress Notes (Signed)
   Subjective:    Patient ID: Jordan Duran, female    DOB: 03-10-1956, 60 y.o.   MRN: [id number]  HPI  The patient is a 60 year old female who returns to pain management for further evaluation and treatment of pain involving the neck and shoulder upper mid lower back lower extremity region. Patient states that the pain of the shoulder has improved in that she is without need for MRI of the shoulder. The patient is without significant pain of the lower back and lower extremity region is well. The patient has had bursitis of the hips and it is without significant discomfort at this time we discussed patient's condition and patient is tolerating fentanyl patch as well and is without the need for hydrocodone acetaminophen or Ambien. We will continue fentanyl patch and we will remain available to consider modification of treatment pending follow-up evaluation. All agreed to suggested treatment plan  Review of Systems     Objective:   Physical Exam  There was minimal tenderness of the cervical facet cervical paraspinal musculature region as well as the thoracic facet thoracic paraspinal muscular region. There was minimal tenderness of the splenius capitis and occipitalis region. The patient was able to perform drop test with mild to moderate tenderness of the acromioclavicular and glenohumeral joint regions noted. The patient was with decreased grip strength slightly. There was evidence of nodules of the phalanges noted without increased warmth erythema noted. There was tenderness over the thoracic region without crepitus of the thoracic region and tenderness over the lumbar region lumbar paraspinal musculature region was with was increased with lateral bending rotation of mild to moderate degree. Mild was mild to moderate tenderness of the PSIS and PII S region as well as the greater trochanteric region iliotibial band region which was with mild tenderness to palpation with straight leg raising tolerate to  30 without increase of pain with dorsiflexion noted. There was negative clonus negative Homans There was decreased EHL strength of mild degree. No sensory deficit of dermatomal distribution detected. Abdomen nontender with no costovertebral tenderness noted      Assessment & Plan:      Degenerative joint disease of shoulder  (status post recent trauma of shoulder)  Degenerative disc disease lumbar spine Lumbar facet syndrome  Greater trochanteric bursitis  Sacroiliac joint dysfunction  Arthritis     PLAN   Continue present medications fentanyl patch and hydrocodone acetaminophen  F/U PCP Dr. Vickki Muff for evaliation of  BP and general medical  condition . Also discuss neurological evaluation with Dr. Vickki Muff as previously mentioned  F/U surgical evaluation. May consider pending follow-up evaluations. We will avoid surgical evaluation at this time  Rheumatological evaluation. Will avoid at this time. Patient will continue the care of Dr. Vickki Muff in this regard as previously discussed  F/U neurological evaluation. Patient has neurological evaluation scheduled  May consider radiofrequency rhizolysis or intraspinal procedures pending response to present treatment and F/U evaluation . We will avoid these procedures at this time  Patient is to call pain management prior to scheduled return appointment should patient have concerns regarding condition

## 2016-05-14 NOTE — Progress Notes (Signed)
Safety precautions to be maintained throughout the outpatient stay will include: orient to surroundings, keep bed in low position, maintain call bell within reach at all times, provide assistance with transfer out of bed and ambulation.  

## 2016-05-24 ENCOUNTER — Ambulatory Visit: Payer: PRIVATE HEALTH INSURANCE

## 2016-06-14 ENCOUNTER — Encounter: Payer: Self-pay | Admitting: Pain Medicine

## 2016-06-14 ENCOUNTER — Ambulatory Visit: Payer: 59 | Attending: Pain Medicine | Admitting: Pain Medicine

## 2016-06-14 VITALS — BP 126/78 | HR 93 | Temp 98.4°F | Resp 18 | Ht 60.0 in | Wt 154.0 lb

## 2016-06-14 DIAGNOSIS — M19019 Primary osteoarthritis, unspecified shoulder: Secondary | ICD-10-CM | POA: Insufficient documentation

## 2016-06-14 DIAGNOSIS — M25511 Pain in right shoulder: Secondary | ICD-10-CM | POA: Diagnosis present

## 2016-06-14 DIAGNOSIS — M19011 Primary osteoarthritis, right shoulder: Secondary | ICD-10-CM

## 2016-06-14 DIAGNOSIS — M7062 Trochanteric bursitis, left hip: Secondary | ICD-10-CM

## 2016-06-14 DIAGNOSIS — M25512 Pain in left shoulder: Secondary | ICD-10-CM | POA: Diagnosis present

## 2016-06-14 DIAGNOSIS — M533 Sacrococcygeal disorders, not elsewhere classified: Secondary | ICD-10-CM | POA: Insufficient documentation

## 2016-06-14 DIAGNOSIS — M7060 Trochanteric bursitis, unspecified hip: Secondary | ICD-10-CM | POA: Insufficient documentation

## 2016-06-14 DIAGNOSIS — M47818 Spondylosis without myelopathy or radiculopathy, sacral and sacrococcygeal region: Secondary | ICD-10-CM

## 2016-06-14 DIAGNOSIS — M7061 Trochanteric bursitis, right hip: Secondary | ICD-10-CM

## 2016-06-14 DIAGNOSIS — R200 Anesthesia of skin: Secondary | ICD-10-CM | POA: Diagnosis not present

## 2016-06-14 DIAGNOSIS — M5136 Other intervertebral disc degeneration, lumbar region: Secondary | ICD-10-CM | POA: Insufficient documentation

## 2016-06-14 DIAGNOSIS — M19012 Primary osteoarthritis, left shoulder: Secondary | ICD-10-CM

## 2016-06-14 DIAGNOSIS — M461 Sacroiliitis, not elsewhere classified: Secondary | ICD-10-CM

## 2016-06-14 MED ORDER — FENTANYL 25 MCG/HR TD PT72: MEDICATED_PATCH | TRANSDERMAL | 0 refills | Status: DC

## 2016-06-14 MED ORDER — HYDROCODONE-ACETAMINOPHEN 7.5-325 MG PO TABS: 1.0000 | ORAL_TABLET | Freq: Four times a day (QID) | ORAL | Status: AC | PRN

## 2016-06-14 MED ORDER — HYDROCODONE-ACETAMINOPHEN 7.5-325 MG PO TABS: ORAL_TABLET | ORAL | 0 refills | Status: AC

## 2016-06-14 MED ORDER — HYDROCODONE-ACETAMINOPHEN 7.5-325 MG PO TABS
1.0000 | ORAL_TABLET | Freq: Four times a day (QID) | ORAL | Status: AC | PRN
  Filled 2016-06-14: qty 1

## 2016-06-14 NOTE — Progress Notes (Signed)
Safety precautions to be maintained throughout the outpatient stay will include: orient to surroundings, keep bed in low position, maintain call bell within reach at all times, provide assistance with transfer out of bed and ambulation.  

## 2016-06-14 NOTE — Patient Instructions (Signed)
PLAN   Continue present medications fentanyl patch and hydrocodone acetaminophen  F/U PCP Dr. Vickki Muff for evaliation of  BP paresthesias of right lower extremity ( consider gout neuropathy and other etiologies) and general medical  condition . Also discuss neurological evaluation with Dr. Vickki Muff as previously mentioned  F/U surgical evaluation. May consider pending follow-up evaluations. We will avoid surgical evaluation at this time  Rheumatological evaluation. Will avoid at this time. Patient will continue the care of Dr. Vickki Muff in this regard as previously discussed  F/U neurological evaluation. Patient has neurological evaluation scheduled. Patient has burning stinging sensation of the third toe of the right lower extremity that radiates upward.  May consider radiofrequency rhizolysis or intraspinal procedures pending response to present treatment and F/U evaluation . We will avoid these procedures at this time  Patient is to call pain management prior to scheduled return appointment should patient have concerns regarding condition

## 2016-06-14 NOTE — Progress Notes (Signed)
The patient is a 60 year old female who returns to pain management for further evaluation and treatment of pain involving the shoulder into back upper and lower extremity region. The patient has return of her bursitis involving the greater trochanteric regions. The patient also states that she has had a shooting sensation burning sensation involving the middle toe of the right foot. There is concern regarding gout versus neuropathy versus radiculopathy. The patient is scheduled to undergo [facility] EMG studies for further assessment of her condition. The patient states the lower back lower extremity pain is fairly well controlled. The patient states that she has had some exacerbation of the symptoms of the lower back lower extremity region overall with the burning stinging sensation of the right lower extremity and that the pain is interfering with activities of daily living despite the use of the fentanyl patch. The patient will continue fentanyl patch as well as hydrocodone acetaminophen for breakthrough pain. We will remain available to consider trochanteric bursa injections and other treatment for pain involving the lower back lower extremity region and other regions. All agreed to suggested treatment plan. We will await the results of the [facility] EMG studies to be performed as well.    Physical examination  There was tenderness of the splenius capitis and occipitalis region palpation which reproduces minimal discomfort with minimal tenderness of the PSIS and PII S region. There was minimal tenderness over the region of the cervical facet region and thoracic facet regions. The patient was with minimal tenderness of the acromioclavicular and glenohumeral joint regions. The patient appeared to be with unremarkable Spurling's maneuver and was able to perform drop test with mild to moderate difficulty. Palpation of the thoracic region was with no significant tenderness to palpation or crepitus of the thoracic  region noted. Palpation over the lumbar paraspinal musculature region was with moderate tenderness to palpation with lateral bending rotation extension and palpation of the lumbar facets reproducing moderate discomfort. Straight leg raising was tolerated to approximately 30 without increased pain with dorsiflexion noted. There was no definite allodynia of the lower extremities especially the right lower extremity and no increased warmth and erythema of the toes noted. There was no sensory deficit or dermatomal distribution of the lower extremity is noted. DTRs appeared to be trace at the knees. There was moderate tenderness of the greater trochanteric region iliotibial band region. There was negative clonus negative Homans. Abdomen was nontender with no costovertebral tenderness noted.      Assessment     Degenerative joint disease of shoulder  (status post recent trauma of shoulder)  Degenerative disc disease lumbar spine Lumbar facet syndrome  Greater trochanteric bursitis  Sacroiliac joint dysfunction  Arthritis  Lower extremity paresthesia  (neuropathy versus gout versus radiculopathy and other etiologies )    PLAN   Continue present medications fentanyl patch and hydrocodone acetaminophen  F/U PCP Dr. Vickki Muff for evaliation of  BP paresthesias of right lower extremity ( consider gout neuropathy radiculopathy and other etiologies) and general medical  condition . Also discuss neurological evaluation with Dr. Vickki Muff as previously mentioned  F/U surgical evaluation. May consider pending follow-up evaluations. We will avoid surgical evaluation at this time  Rheumatological evaluation. Will avoid at this time. Patient will continue the care of Dr. Vickki Muff in this regard as previously discussed  F/U neurological evaluation. Patient has neurological evaluation scheduled. Patient has burning stinging sensation of the third toe of the right lower extremity that radiates upward.  May  consider radiofrequency rhizolysis or  intraspinal procedures pending response to present treatment and F/U evaluation . We will avoid these procedures at this time  Patient is to call pain management prior to scheduled return appointment should patient have concerns regarding condition

## 2016-07-04 ENCOUNTER — Telehealth: Payer: Self-pay | Admitting: *Deleted

## 2016-07-09 ENCOUNTER — Encounter: Payer: Self-pay | Admitting: Pain Medicine

## 2016-07-09 ENCOUNTER — Ambulatory Visit: Payer: 59 | Attending: Pain Medicine | Admitting: Pain Medicine

## 2016-07-09 VITALS — BP 167/81 | HR 95 | Temp 99.0°F | Resp 16 | Ht 60.0 in | Wt 152.0 lb

## 2016-07-09 DIAGNOSIS — M7060 Trochanteric bursitis, unspecified hip: Secondary | ICD-10-CM | POA: Diagnosis not present

## 2016-07-09 DIAGNOSIS — M19012 Primary osteoarthritis, left shoulder: Secondary | ICD-10-CM

## 2016-07-09 DIAGNOSIS — M25511 Pain in right shoulder: Secondary | ICD-10-CM | POA: Diagnosis present

## 2016-07-09 DIAGNOSIS — Z87828 Personal history of other (healed) physical injury and trauma: Secondary | ICD-10-CM | POA: Insufficient documentation

## 2016-07-09 DIAGNOSIS — M7061 Trochanteric bursitis, right hip: Secondary | ICD-10-CM

## 2016-07-09 DIAGNOSIS — M461 Sacroiliitis, not elsewhere classified: Secondary | ICD-10-CM

## 2016-07-09 DIAGNOSIS — M7062 Trochanteric bursitis, left hip: Secondary | ICD-10-CM

## 2016-07-09 DIAGNOSIS — M533 Sacrococcygeal disorders, not elsewhere classified: Secondary | ICD-10-CM | POA: Insufficient documentation

## 2016-07-09 DIAGNOSIS — M19019 Primary osteoarthritis, unspecified shoulder: Secondary | ICD-10-CM | POA: Diagnosis not present

## 2016-07-09 DIAGNOSIS — M19011 Primary osteoarthritis, right shoulder: Secondary | ICD-10-CM

## 2016-07-09 DIAGNOSIS — M542 Cervicalgia: Secondary | ICD-10-CM | POA: Diagnosis present

## 2016-07-09 DIAGNOSIS — M47818 Spondylosis without myelopathy or radiculopathy, sacral and sacrococcygeal region: Secondary | ICD-10-CM

## 2016-07-09 DIAGNOSIS — G20 Parkinson's disease: Secondary | ICD-10-CM | POA: Insufficient documentation

## 2016-07-09 DIAGNOSIS — M25512 Pain in left shoulder: Secondary | ICD-10-CM | POA: Diagnosis present

## 2016-07-09 DIAGNOSIS — M5136 Other intervertebral disc degeneration, lumbar region: Secondary | ICD-10-CM | POA: Diagnosis not present

## 2016-07-09 MED ORDER — FENTANYL 25 MCG/HR TD PT72: MEDICATED_PATCH | TRANSDERMAL | 0 refills | Status: DC

## 2016-07-09 NOTE — Progress Notes (Signed)
Safety precautions to be maintained throughout the outpatient stay will include: orient to surroundings, keep bed in low position, maintain call bell within reach at all times, provide assistance with transfer out of bed and ambulation.  

## 2016-07-09 NOTE — Patient Instructions (Addendum)
PLAN   Continue present medications fentanyl patch and hydrocodone acetaminophen  F/U PCP Dr. Vickki Muff for evaliation of  BP Parkinson's and general medical  condition . Also discuss neurological evaluation with Dr. Vickki Muff as previously mentioned  F/U surgical evaluation. May consider pending follow-up evaluations. We will avoid surgical evaluation at this time  Rheumatological evaluation. Will avoid at this time. Patient will continue the care of Dr. Vickki Muff in this regard as previously discussed  F/U neurological evaluation.  Patient will follow-up with neurologist regarding Parkinson's as discussed  May consider radiofrequency rhizolysis or intraspinal procedures pending response to present treatment and F/U evaluation . We will avoid these procedures at this time  Patient is to call pain management prior to scheduled return appointment should patient have concerns regarding condition

## 2016-07-10 NOTE — Progress Notes (Signed)
    The patient is a 60 year old female who returns to pain management for further evaluation and treatment of pain involving the neck shoulders and upper mid lower back and lower extremity region. The patient has been with concern regarding tremor and neurological evaluation has revealed patient to be with Parkinson's area the patient will undergo follow-up neurological evaluation and we will we'll continue present medication regimen at this time. The patient states that she has noticed some improvement of her twitching since she has begun medications as prescribed by her neurologist. We remain available to consider additional modifications of treatment regimen pending response to present treatment. All agreed to suggested treatment plan.    Physical examination there was tenderness to palpation of paraspinal misreading cervical and cervical facet region with moderate tenderness over the splenius capitis and occipitalis regions. The patient appeared to be with bilaterally equal grip strength with Tinel and Phalen's maneuver reproducing minimal discomfort. There was tenderness over the thoracic region thoracic facet region with moderate tenderness over the lower thoracic paraspinal musculature region without crepitus of the thoracic region noted. Palpation over the region of the lumbar region was of increased pain with lateral bending rotation extension and palpation over the lumbar facets. There was straight leg raising tolerates approximately 20 without increased pain with dorsiflexion noted. No sensory deficit dermatomal dystrophy detected. There was moderate tenderness of the PSIS and PII S region and greater trochanteric region iliotibial band region. No sensory deficit or dermatomal distribution detected. Abdomen nontender without costovertebral tenderness noted    Assessment  Degenerative joint disease of shoulder  (status post recent trauma of shoulder)  Degenerative disc disease lumbar  spine Lumbar facet syndrome  Greater trochanteric bursitis  Sacroiliac joint dysfunction  Parkinson's     PLAN   Continue present medications fentanyl patch and hydrocodone acetaminophen  F/U PCP Dr. Vickki Muff for evaliation of  BP Parkinson's and general medical  condition .   F/U surgical evaluation. May consider pending follow-up evaluations. We will avoid surgical evaluation at this time  Rheumatological evaluation. Will avoid at this time. Patient will continue the care of Dr. Vickki Muff in this regard as previously discussed  F/U neurological evaluation.  Patient will follow-up with neurologist regarding Parkinson's as discussed  May consider radiofrequency rhizolysis or intraspinal procedures pending response to present treatment and F/U evaluation . We will avoid these procedures at this time  Patient is to call pain management prior to scheduled return appointment should patient have concerns regarding condition

## 2016-07-18 ENCOUNTER — Ambulatory Visit
Admission: EM | Admit: 2016-07-18 | Discharge: 2016-07-18 | Disposition: A | Discharge status: Home / Self Care | Payer: PRIVATE HEALTH INSURANCE | Attending: Family Medicine | Admitting: Family Medicine

## 2016-07-18 DIAGNOSIS — N390 Urinary tract infection, site not specified: Secondary | ICD-10-CM | POA: Diagnosis not present

## 2016-07-18 LAB — URINALYSIS COMPLETE WITH MICROSCOPIC (ARMC ONLY)
BILIRUBIN URINE: NEGATIVE
Glucose, UA: NEGATIVE mg/dL
Hgb urine dipstick: NEGATIVE
KETONES UR: NEGATIVE mg/dL
NITRITE: NEGATIVE
Specific Gravity, Urine: 1.025 (ref 1.005–1.030)
pH: 5.5 (ref 5.0–8.0)

## 2016-07-18 MED ORDER — NITROFURANTOIN MONOHYD MACRO 100 MG PO CAPS: 100.0000 mg | ORAL_CAPSULE | Freq: Two times a day (BID) | ORAL | 0 refills | Status: DC

## 2016-07-18 NOTE — ED Provider Notes (Signed)
[facility]    CSN: [id number] Arrival date & time: 07/18/16  [id number]  First Provider Contact:  None       History   Chief Complaint Chief Complaint  Patient presents with  . Urinary Tract Infection    HPI SYMPHANI BOWAN is a 60 y.o. female.   HPI: Patient presents today with symptoms of urinary frequency and dysuria. Patient states that she has history of UTIs in the past. She denies any hematuria, fever, chills, flank pain, abdominal pain. She had one dose of Macrobid at home that she took yesterday. She has no further antibiotic treatment at home.  Past Medical History:  Diagnosis Date  . Arthritis   . Cancer ([place])    Melanoma of the Skin  . Depression   . Fracture of L1 vertebra ([place])   . Fracture of T12 vertebra ([place])   . Heart murmur   . Hyperlipidemia   . Parkinson disease ([place])   . Recurrent UTI     Patient Active Problem List   Diagnosis Date Noted  . DDD (degenerative disc disease), lumbar 03/10/2015  . DJD of shoulder 03/10/2015  . Greater trochanteric bursitis of both hips 03/10/2015  . Degenerative joint disease of sacroiliac joint 03/10/2015    Past Surgical History:  Procedure Laterality Date  . ABDOMINAL HYSTERECTOMY    . CESAREAN SECTION    . CHOLECYSTECTOMY    . COLONOSCOPY WITH PROPOFOL N/A 07/29/2015   Procedure: COLONOSCOPY WITH PROPOFOL;  Surgeon: Hulen Luster, MD;  Location: [facility] ENDOSCOPY;  Service: Gastroenterology;  Laterality: N/A;  . TUBAL LIGATION      OB History    No data available       Home Medications    Prior to Admission medications   Medication Sig Start Date End Date Taking? Authorizing Provider  citalopram (CELEXA) 20 MG tablet Take 20 mg by mouth daily.   Yes Historical Provider, MD  fentaNYL (DURAGESIC - DOSED MCG/HR) 25 MCG/HR patch Apply 1 patch to skin every 2 days if tolerated 07/09/16  Yes Mohammed Kindle, MD  HYDROcodone-acetaminophen [facility]) 7.5-325 MG tablet Limit one half to one tab by mouth per day  or twice per day if tolerated 06/14/16  Yes Mohammed Kindle, MD  Multiple Vitamins-Minerals (MULTIVITAMIN ADULT PO) Take by mouth.   Yes Historical Provider, MD  naproxen sodium (ANAPROX) 220 MG tablet Take 220 mg by mouth 3 (three) times daily with meals. Reported on 01/19/2016   Yes Historical Provider, MD  nitrofurantoin (MACRODANTIN) 100 MG capsule Take 100 mg by mouth 4 (four) times daily.   Yes Historical Provider, MD  nortriptyline (PAMELOR) 25 MG capsule Take 25 mg by mouth at bedtime.   Yes Historical Provider, MD  pramipexole (MIRAPEX) 0.5 MG tablet Take 0.5 mg by mouth 3 (three) times daily.   Yes Historical Provider, MD    Family History Family History  Problem Relation Age of Onset  . Arthritis Mother   . Depression Mother   . Diabetes Mother   . Early death Mother   . Varicose Veins Mother   . Heart disease Father   . Hypertension Father   . Arthritis Sister   . Depression Sister   . Drug abuse Sister   . Hypertension Maternal Grandmother   . Stroke Maternal Grandmother   . Varicose Veins Maternal Grandmother   . Diabetes Paternal Grandmother   . Arthritis Paternal Grandmother   . Depression Son   . Diabetes Maternal Aunt   .  Depression Maternal Aunt     Social History Social History  Substance Use Topics  . Smoking status: Former Research scientist (life sciences)  . Smokeless tobacco: Former Systems developer    Quit date: 07/10/1974  . Alcohol use No     Allergies   Sulfa antibiotics and Demerol [meperidine]   Review of Systems Review of Systems: Negative except mentioned above.    Physical Exam Triage Vital Signs ED Triage Vitals  Enc Vitals Group     BP 07/18/16 0925 139/79     Pulse Rate 07/18/16 0925 95     Resp 07/18/16 0925 18     Temp 07/18/16 0925 98.2 F (36.8 C)     Temp Source 07/18/16 0925 Oral     SpO2 07/18/16 0925 96 %     Weight 07/18/16 0923 152 lb (68.9 kg)     Height 07/18/16 0923 5' (1.524 m)     Head Circumference --      Peak Flow --      Pain Score 07/18/16  0924 5     Pain Loc --      Pain Edu? --      Excl. in [place]? --    No data found.   Updated Vital Signs BP 139/79 (BP Location: Left Arm)   Pulse 95   Temp 98.2 F (36.8 C) (Oral)   Resp 18   Ht 5' (1.524 m)   Wt 152 lb (68.9 kg)   SpO2 96%   BMI 29.69 kg/m     Physical Exam:  GENERAL: NAD HEENT: no pharyngeal erythema, no exudate, no cervical LAD RESP: CTA B CARD: RRR ABD: +BS, mild suprapubic tenderness, no rebound or guarding appreciated, no flank tenderness   NEURO: CN II-XII grossly intact    UC Treatments / Results  Labs (all labs ordered are listed, but only abnormal results are displayed) Labs Reviewed  URINALYSIS COMPLETEWITH MICROSCOPIC ([facility] ONLY) - Abnormal; Notable for the following:       Result Value   Protein, ur TRACE (*)    Leukocytes, UA TRACE (*)    Bacteria, UA FEW (*)    Squamous Epithelial / LPF 0-5 (*)    All other components within normal limits    EKG  EKG Interpretation None       Radiology No results found.  Procedures Procedures (including critical care time)  Medications Ordered in UC Medications - No data to display   Initial Impression / Assessment and Plan / UC Course  I have reviewed the triage vital signs and the nursing notes.  Pertinent labs & imaging results that were available during my care of the patient were reviewed by me and considered in my medical decision making (see chart for details).  Clinical Course   A/P: UTI - will treat with Macrobid for 1 week, send urine for culture rest, hydration, seek medical attention if symptoms persist or worsen as discussed.  Final Clinical Impressions(s) / UC Diagnoses   Final diagnoses:  None    New Prescriptions New Prescriptions   No medications on file     Paulina Fusi, MD 07/18/16 [phone]

## 2016-07-18 NOTE — ED Triage Notes (Signed)
Patient c/o burning, cramping while urinating. And lots of frequency.

## 2016-07-19 ENCOUNTER — Ambulatory Visit: Payer: PRIVATE HEALTH INSURANCE | Admitting: Pain Medicine

## 2016-07-25 ENCOUNTER — Telehealth: Payer: Self-pay | Admitting: *Deleted

## 2016-08-06 ENCOUNTER — Other Ambulatory Visit: Payer: Self-pay | Admitting: Pain Medicine

## 2016-08-07 ENCOUNTER — Ambulatory Visit: Payer: PRIVATE HEALTH INSURANCE

## 2016-08-16 ENCOUNTER — Ambulatory Visit
Admission: RE | Admit: 2016-08-16 | Discharge: 2016-08-16 | Disposition: A | Discharge status: Home / Self Care | Payer: PRIVATE HEALTH INSURANCE | Source: Ambulatory Visit | Attending: Pain Medicine | Admitting: Pain Medicine

## 2016-08-16 DIAGNOSIS — M47818 Spondylosis without myelopathy or radiculopathy, sacral and sacrococcygeal region: Secondary | ICD-10-CM | POA: Diagnosis not present

## 2016-08-16 DIAGNOSIS — M75101 Unspecified rotator cuff tear or rupture of right shoulder, not specified as traumatic: Secondary | ICD-10-CM | POA: Insufficient documentation

## 2016-08-16 DIAGNOSIS — M461 Sacroiliitis, not elsewhere classified: Secondary | ICD-10-CM

## 2016-08-16 DIAGNOSIS — M5136 Other intervertebral disc degeneration, lumbar region: Secondary | ICD-10-CM | POA: Insufficient documentation

## 2016-08-16 DIAGNOSIS — M19012 Primary osteoarthritis, left shoulder: Secondary | ICD-10-CM | POA: Insufficient documentation

## 2016-08-16 DIAGNOSIS — M19011 Primary osteoarthritis, right shoulder: Secondary | ICD-10-CM | POA: Insufficient documentation

## 2016-08-16 DIAGNOSIS — M7581 Other shoulder lesions, right shoulder: Secondary | ICD-10-CM | POA: Diagnosis not present

## 2016-08-16 DIAGNOSIS — M7062 Trochanteric bursitis, left hip: Secondary | ICD-10-CM | POA: Insufficient documentation

## 2016-08-16 DIAGNOSIS — M7061 Trochanteric bursitis, right hip: Secondary | ICD-10-CM | POA: Diagnosis not present

## 2016-11-12 ENCOUNTER — Other Ambulatory Visit: Payer: Self-pay | Admitting: Pain Medicine

## 2017-03-27 IMAGING — CR DG RIBS W/ CHEST 3+V*L*
5 series · 5 of 5 positions shown · non-contrast
Comparison: None.

CLINICAL DATA: Acute left rib pain after fall on steps 3 days ago.

EXAM:
LEFT RIBS AND CHEST - 3+ VIEW

[chest pa]
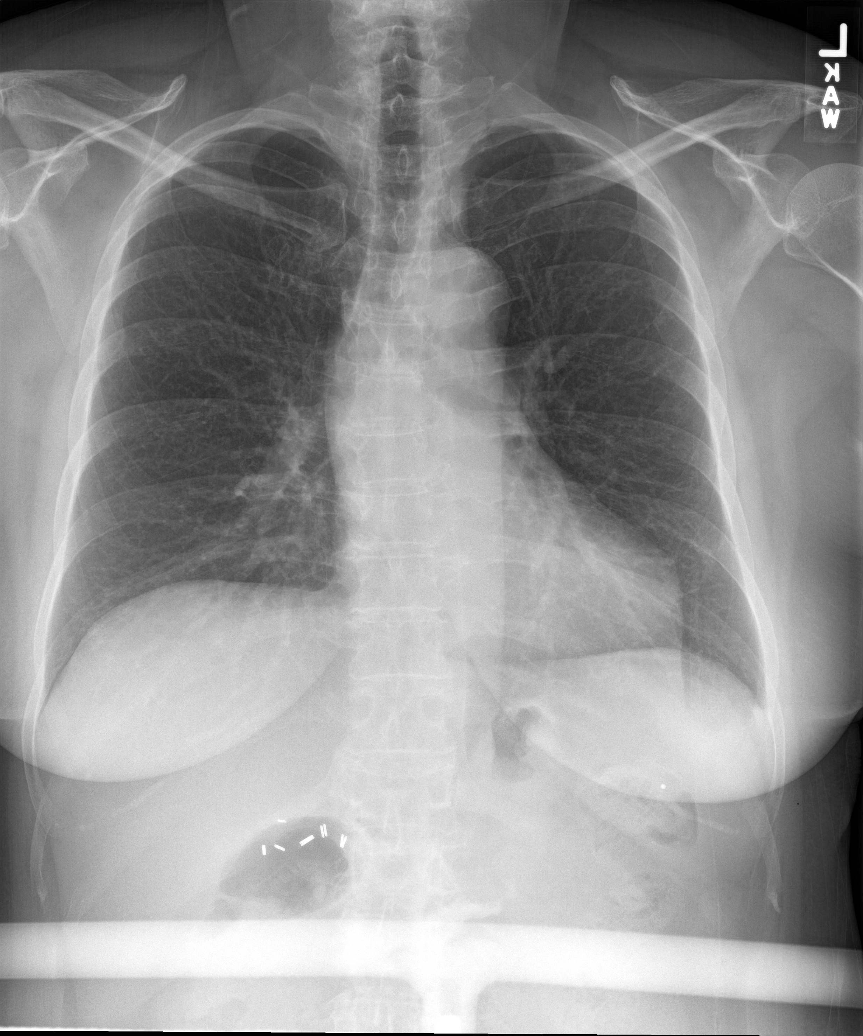

[rib pa (1 of 2)]
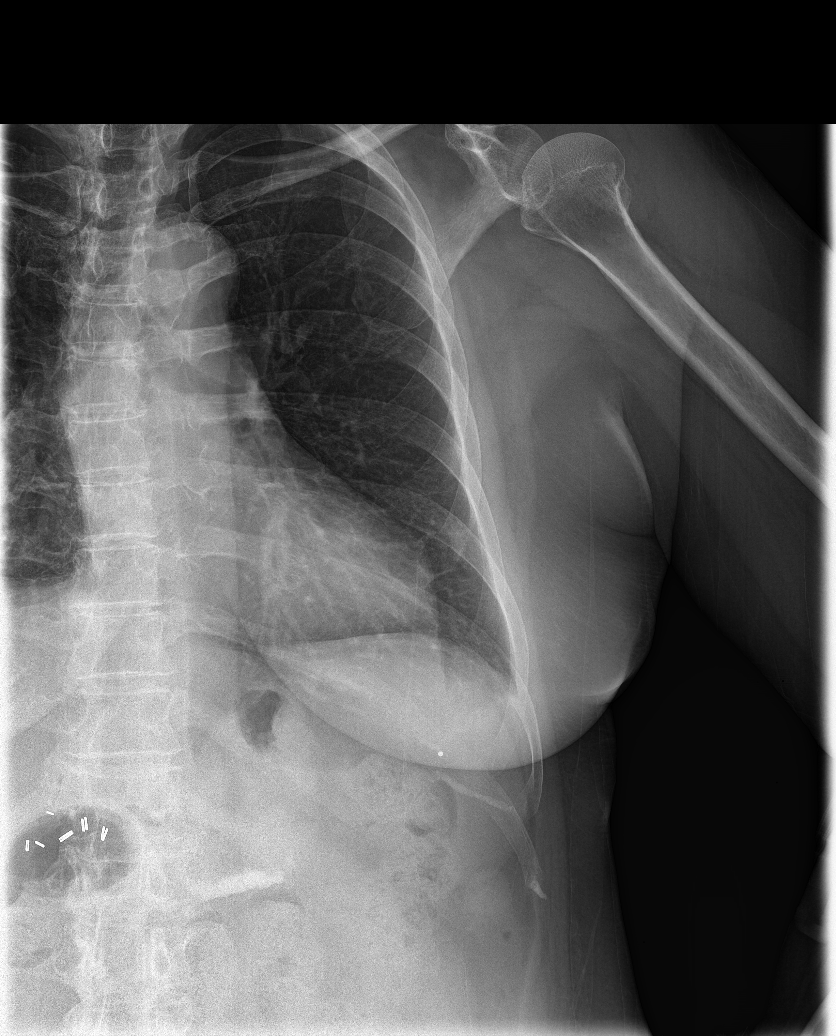

[rib pa (2 of 2)]
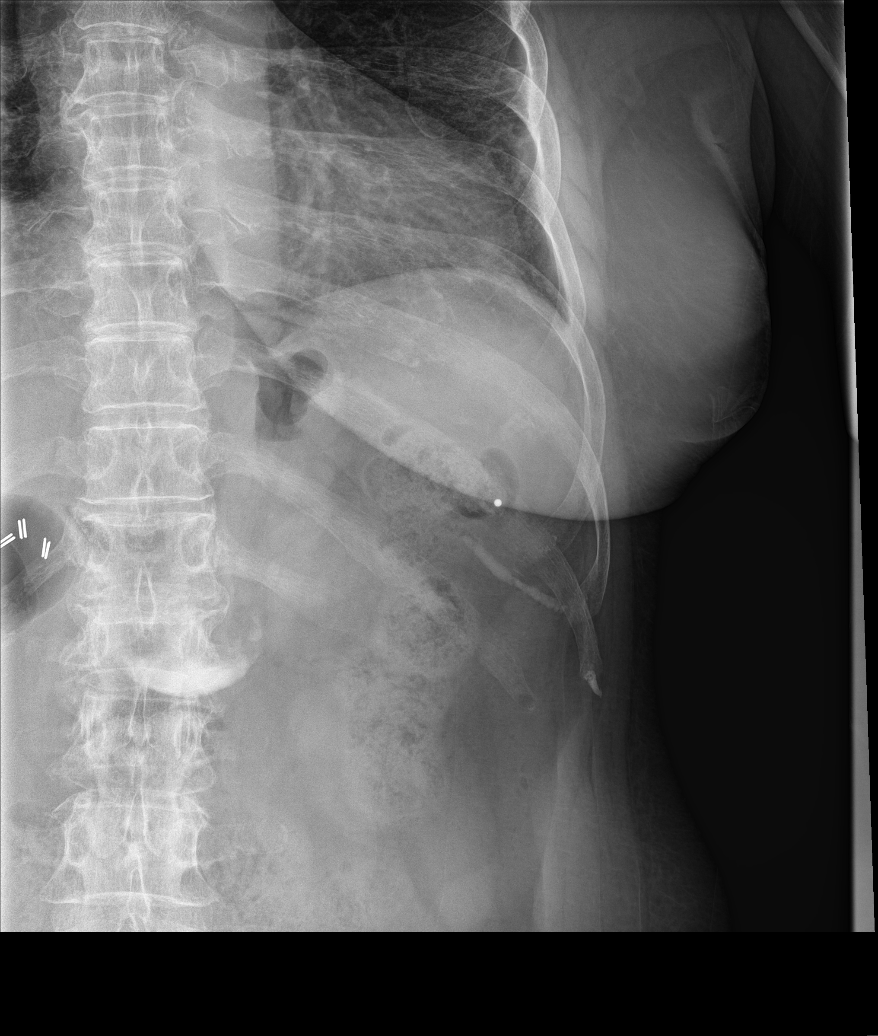

[rib obl (1 of 2)]
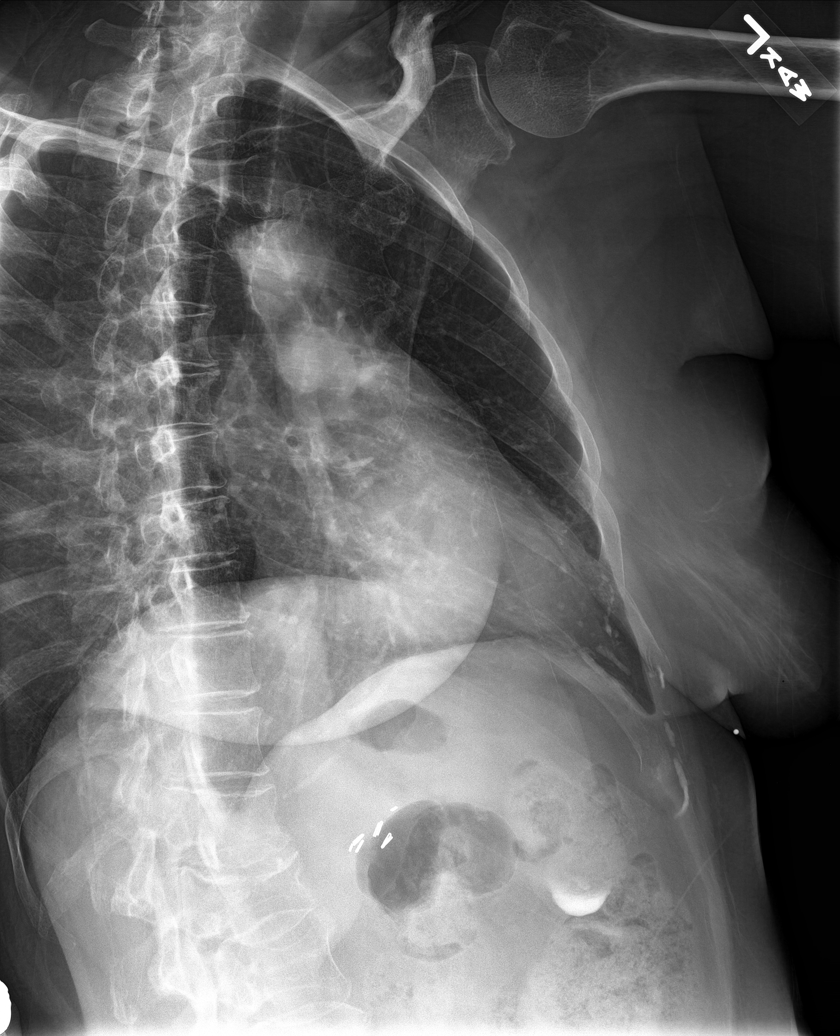

[rib obl (2 of 2)]
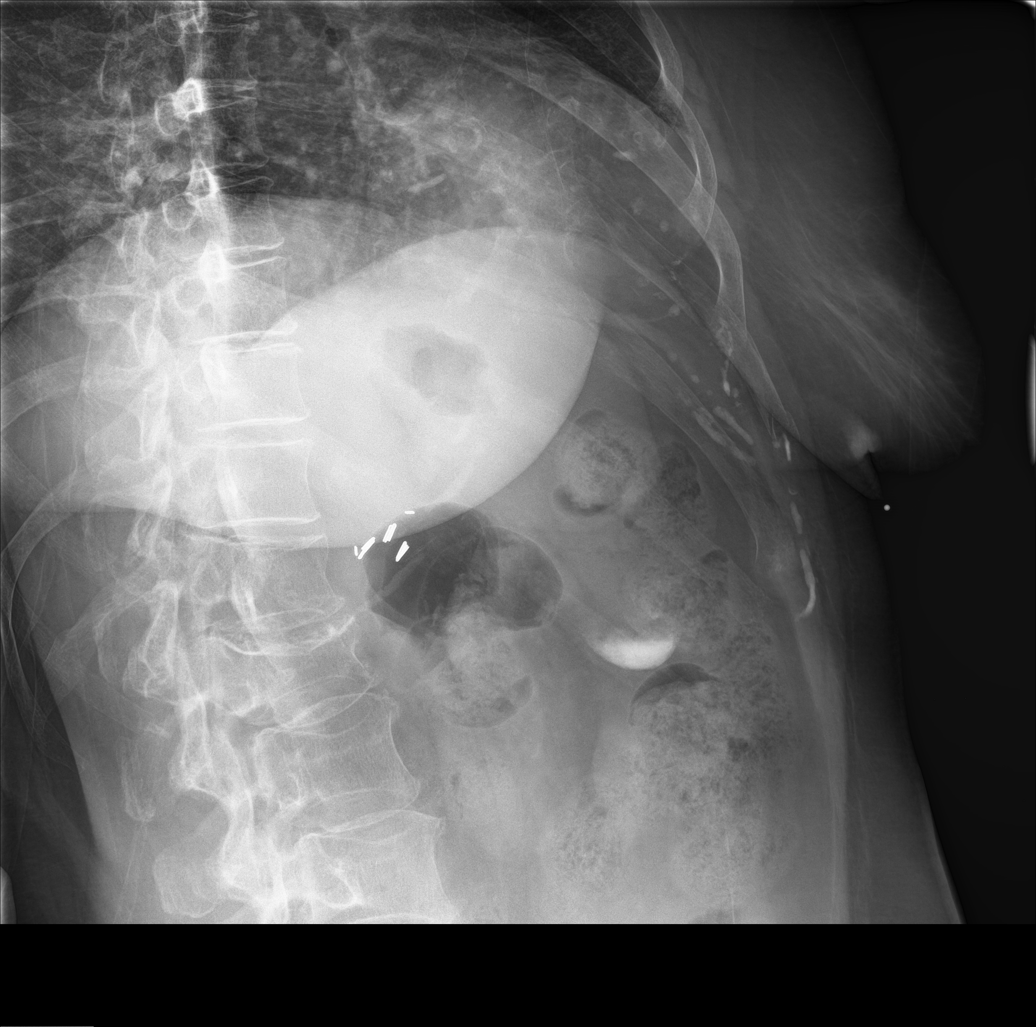

[5 of 5 positions shown; findings below may reference images not displayed]

FINDINGS: Minimally displaced fracture seen involving the anterior portion of
the left seventh rib. There is no evidence of pneumothorax or
pleural effusion. Both lungs are clear. Heart size and mediastinal
contours are within normal limits.
IMPRESSION: Minimally displaced left seventh rib fracture. No acute
cardiopulmonary abnormality seen.

## 2017-05-07 NOTE — Telephone Encounter (Signed)
NA

## 2018-07-09 ENCOUNTER — Other Ambulatory Visit
Admission: RE | Admit: 2018-07-09 | Discharge: 2018-07-09 | Disposition: A | Discharge status: Home / Self Care | Payer: BLUE CROSS/BLUE SHIELD | Source: Ambulatory Visit | Attending: Orthopedic Surgery | Admitting: Orthopedic Surgery

## 2018-07-09 DIAGNOSIS — Z01812 Encounter for preprocedural laboratory examination: Secondary | ICD-10-CM | POA: Insufficient documentation

## 2018-07-09 LAB — URINALYSIS, ROUTINE W REFLEX MICROSCOPIC
BILIRUBIN URINE: NEGATIVE
GLUCOSE, UA: NEGATIVE mg/dL
Hgb urine dipstick: NEGATIVE
KETONES UR: NEGATIVE mg/dL
LEUKOCYTES UA: NEGATIVE
Nitrite: NEGATIVE
PH: 5 (ref 5.0–8.0)
Protein, ur: NEGATIVE mg/dL
Specific Gravity, Urine: 1.025 (ref 1.005–1.030)

## 2018-07-11 LAB — URINE CULTURE

## 2018-10-18 ENCOUNTER — Other Ambulatory Visit: Payer: Self-pay

## 2018-10-18 ENCOUNTER — Ambulatory Visit
Admission: EM | Admit: 2018-10-18 | Discharge: 2018-10-18 | Disposition: A | Discharge status: Home / Self Care | Payer: Medicare PPO | Attending: Family Medicine | Admitting: Family Medicine

## 2018-10-18 DIAGNOSIS — N3001 Acute cystitis with hematuria: Secondary | ICD-10-CM

## 2018-10-18 DIAGNOSIS — Z87440 Personal history of urinary (tract) infections: Secondary | ICD-10-CM

## 2018-10-18 LAB — URINALYSIS, COMPLETE (UACMP) WITH MICROSCOPIC

## 2018-10-18 MED ORDER — CEPHALEXIN 500 MG PO CAPS: 500.0000 mg | ORAL_CAPSULE | Freq: Two times a day (BID) | ORAL | 0 refills | Status: DC

## 2018-10-18 NOTE — Discharge Instructions (Signed)
Antibiotic as prescribed.  Take care  Dr. Malcolm Hetz  

## 2018-10-18 NOTE — ED Triage Notes (Signed)
Pt with urinary sx. Burning with urination

## 2018-10-19 NOTE — ED Provider Notes (Signed)
[facility]    CSN: [id number] Arrival date & time: 10/18/18  1432  History   Chief Complaint Chief Complaint  Patient presents with  . Dysuria  HPI   62 year old female with a history of recurrent UTIs presents with concerns for UTI.  Patient reports that she just recently finished a course of Macrobid for UTI.  Patient reports that her symptoms have now restarted.  Patient reports dysuria, frequency, urgency.  Pain is quite troublesome.  Pain improves with Azo. No fever.  No chills.  Reports suprapubic abdominal pain.  No flank pain.  No known exacerbating factors. No other complaints.  Past Medical History:  Diagnosis Date  . Arthritis   . Cancer ([place])    Melanoma of the Skin  . Depression   . Fracture of L1 vertebra ([place])   . Fracture of T12 vertebra ([place])   . Heart murmur   . Hyperlipidemia   . Parkinson disease ([place])   . Recurrent UTI     Patient Active Problem List   Diagnosis Date Noted  . DDD (degenerative disc disease), lumbar 03/10/2015  . DJD of shoulder 03/10/2015  . Greater trochanteric bursitis of both hips 03/10/2015  . Degenerative joint disease of sacroiliac joint 03/10/2015    Past Surgical History:  Procedure Laterality Date  . ABDOMINAL HYSTERECTOMY    . CESAREAN SECTION    . CHOLECYSTECTOMY    . COLONOSCOPY WITH PROPOFOL N/A 07/29/2015   Procedure: COLONOSCOPY WITH PROPOFOL;  Surgeon: Hulen Luster, MD;  Location: [facility] ENDOSCOPY;  Service: Gastroenterology;  Laterality: N/A;  . TUBAL LIGATION      OB History   No obstetric history on file.    Home Medications    Prior to Admission medications   Medication Sig Start Date End Date Taking? Authorizing Provider  Carbidopa-Levodopa ER (SINEMET CR) 25-100 MG tablet controlled release Take by mouth. 09/05/18 10/13/19 Yes [provider]  cephALEXin (KEFLEX) 500 MG capsule Take 1 capsule (500 mg total) by mouth 2 (two) times daily. 10/18/18   Coral Spikes, DO  citalopram  (CELEXA) 20 MG tablet Take 20 mg by mouth daily.    [provider]  HYDROcodone-acetaminophen (NORCO) 7.5-325 MG tablet Limit one half to one tab by mouth per day or twice per day if tolerated 06/14/16   Mohammed Kindle, MD  Multiple Vitamins-Minerals (MULTIVITAMIN ADULT PO) Take by mouth.    [provider]  nortriptyline (PAMELOR) 25 MG capsule Take 25 mg by mouth at bedtime.    [provider]  pramipexole (MIRAPEX) 0.5 MG tablet Take 0.5 mg by mouth 3 (three) times daily.    [provider]    Family History Family History  Problem Relation Age of Onset  . Arthritis Mother   . Depression Mother   . Diabetes Mother   . Early death Mother   . Varicose Veins Mother   . Heart disease Father   . Hypertension Father   . Arthritis Sister   . Depression Sister   . Drug abuse Sister   . Hypertension Maternal Grandmother   . Stroke Maternal Grandmother   . Varicose Veins Maternal Grandmother   . Diabetes Paternal Grandmother   . Arthritis Paternal Grandmother   . Depression Son   . Diabetes Maternal Aunt   . Depression Maternal Aunt     Social History Social History   Tobacco Use  . Smoking status: Former Research scientist (life sciences)  . Smokeless tobacco: Former Systems developer    Quit date:  07/10/1974  Substance Use Topics  . Alcohol use: No    Alcohol/week: 0.0 standard drinks  . Drug use: No    Allergies   Sulfa antibiotics and Demerol [meperidine]   Review of Systems Review of Systems  Constitutional: Negative for fever.  Gastrointestinal: Positive for abdominal pain.  Genitourinary: Positive for dysuria, frequency and urgency.   Physical Exam Triage Vital Signs ED Triage Vitals  Enc Vitals Group     BP 10/18/18 1452 135/89     Pulse Rate 10/18/18 1452 100     Resp 10/18/18 1452 16     Temp 10/18/18 1452 98.2 F (36.8 C)     Temp Source 10/18/18 1452 Oral     SpO2 10/18/18 1452 97 %     Weight 10/18/18 1453 154 lb (69.9 kg)     Height 10/18/18 1453 5'  (1.524 m)     Head Circumference --      Peak Flow --      Pain Score 10/18/18 1453 3     Pain Loc --      Pain Edu? --      Excl. in [place]? --    Updated Vital Signs BP 135/89 (BP Location: Left Arm)   Pulse 100   Temp 98.2 F (36.8 C) (Oral)   Resp 16   Ht 5' (1.524 m)   Wt 69.9 kg   SpO2 97%   BMI 30.08 kg/m   Visual Acuity Right Eye Distance:   Left Eye Distance:   Bilateral Distance:    Right Eye Near:   Left Eye Near:    Bilateral Near:     Physical Exam Vitals signs and nursing note reviewed.  Constitutional:      General: She is not in acute distress. HENT:     Head: Normocephalic and atraumatic.  Cardiovascular:     Rate and Rhythm: Normal rate and regular rhythm.  Pulmonary:     Effort: Pulmonary effort is normal. No respiratory distress.     Breath sounds: No wheezing, rhonchi or rales.  Abdominal:     General: Abdomen is flat. There is no distension.     Comments: Suprapubic tenderness to palpation.  Neurological:     Mental Status: She is alert.  Psychiatric:        Mood and Affect: Mood normal.        Behavior: Behavior normal.    UC Treatments / Results  Labs (all labs ordered are listed, but only abnormal results are displayed) Labs Reviewed  URINALYSIS, COMPLETE (UACMP) WITH MICROSCOPIC - Abnormal; Notable for the following components:      Result Value   Color, Urine ORANGE (*)    APPearance CLOUDY (*)    Glucose, UA   (*)    Value: TEST NOT REPORTED DUE TO COLOR INTERFERENCE OF URINE PIGMENT   Hgb urine dipstick   (*)    Value: TEST NOT REPORTED DUE TO COLOR INTERFERENCE OF URINE PIGMENT   Bilirubin Urine   (*)    Value: TEST NOT REPORTED DUE TO COLOR INTERFERENCE OF URINE PIGMENT   Ketones, ur   (*)    Value: TEST NOT REPORTED DUE TO COLOR INTERFERENCE OF URINE PIGMENT   Protein, ur   (*)    Value: TEST NOT REPORTED DUE TO COLOR INTERFERENCE OF URINE PIGMENT   Nitrite   (*)    Value: TEST NOT REPORTED DUE TO COLOR INTERFERENCE OF  URINE PIGMENT   Leukocytes, UA   (*)  Value: TEST NOT REPORTED DUE TO COLOR INTERFERENCE OF URINE PIGMENT   Bacteria, UA RARE (*)    All other components within normal limits  URINE CULTURE    EKG None  Radiology No results found.  Procedures Procedures (including critical care time)  Medications Ordered in UC Medications - No data to display  Initial Impression / Assessment and Plan / UC Course  I have reviewed the triage vital signs and the nursing notes.  Pertinent labs & imaging results that were available during my care of the patient were reviewed by me and considered in my medical decision making (see chart for details).    62 year old female presents with UTI.  Treating with Keflex.  Sending culture.  Final Clinical Impressions(s) / UC Diagnoses   Final diagnoses:  Acute cystitis with hematuria     Discharge Instructions     Antibiotic as prescribed.  Take care  Dr. Lacinda Axon     ED Prescriptions    Medication Sig Dispense Auth. Provider   cephALEXin (KEFLEX) 500 MG capsule Take 1 capsule (500 mg total) by mouth 2 (two) times daily. 14 capsule Coral Spikes, DO     Controlled Substance Prescriptions [place] Controlled Substance Registry consulted? Not Applicable   Coral Spikes, DO 10/19/18 [phone]

## 2018-10-20 LAB — URINE CULTURE: Culture: NO GROWTH

## 2019-04-29 ENCOUNTER — Encounter: Payer: Self-pay | Admitting: Emergency Medicine

## 2019-04-29 ENCOUNTER — Other Ambulatory Visit: Payer: Self-pay

## 2019-04-29 ENCOUNTER — Ambulatory Visit
Admission: EM | Admit: 2019-04-29 | Discharge: 2019-04-29 | Disposition: A | Discharge status: Home / Self Care | Payer: Medicare Other | Attending: Emergency Medicine | Admitting: Emergency Medicine

## 2019-04-29 DIAGNOSIS — Z87891 Personal history of nicotine dependence: Secondary | ICD-10-CM

## 2019-04-29 DIAGNOSIS — R300 Dysuria: Secondary | ICD-10-CM | POA: Insufficient documentation

## 2019-04-29 LAB — URINALYSIS, COMPLETE (UACMP) WITH MICROSCOPIC
Bacteria, UA: NONE SEEN
Glucose, UA: NEGATIVE mg/dL
Ketones, ur: 15 mg/dL — AB
Nitrite: NEGATIVE
Specific Gravity, Urine: >1.03 — ABNORMAL HIGH (ref 1.005–1.030)
Squamous Epithelial / LPF: NONE SEEN (ref 0–5)
WBC, UA: >50 WBC/hpf (ref 0–5)
pH: 5.5 (ref 5.0–8.0)

## 2019-04-29 MED ORDER — NITROFURANTOIN MONOHYD MACRO 100 MG PO CAPS: 100.0000 mg | ORAL_CAPSULE | Freq: Two times a day (BID) | ORAL | 0 refills | Status: AC

## 2019-04-29 NOTE — ED Triage Notes (Signed)
Patient c/o burning when urinating that started early this morning. Patient denies fevers.

## 2019-04-29 NOTE — Discharge Instructions (Addendum)
Take medication as prescribed. Rest. Drink plenty of fluids. Monitor.  ° °Follow up with your primary care physician this week as needed. Return to Urgent care for new or worsening concerns.  ° °

## 2019-04-29 NOTE — ED Provider Notes (Signed)
[facility] ____________________________________________  Time seen: Approximately 2:29 PM  I have reviewed the triage vital signs and the nursing notes.   HISTORY  Chief Complaint Dysuria   HPI Jordan Duran is a 63 y.o. female presenting for evaluation of urinary frequency, urinary urgency and burning with urination that started this morning.  Patient reports history of recurrent UTIs with most recent in May that was treated with Keflex.  States she was recently at the beach and feel like this was a trigger for her UTI as well as sexual activity.  Denies other abdominal pain, back pain.  Denies vomiting, diarrhea or vaginal complaints.  Continues eat and drink well.  Denies chest pain, shortness of breath, fevers, recent sickness.  Reports otherwise doing well.  Clarisse Gouge, MD: PCP    Past Medical History:  Diagnosis Date  . Arthritis   . Cancer ([place])    Melanoma of the Skin  . Depression   . Fracture of L1 vertebra ([place])   . Fracture of T12 vertebra ([place])   . Heart murmur   . Hyperlipidemia   . Parkinson disease ([place])   . Recurrent UTI     Patient Active Problem List   Diagnosis Date Noted  . DDD (degenerative disc disease), lumbar 03/10/2015  . DJD of shoulder 03/10/2015  . Greater trochanteric bursitis of both hips 03/10/2015  . Degenerative joint disease of sacroiliac joint 03/10/2015    Past Surgical History:  Procedure Laterality Date  . ABDOMINAL HYSTERECTOMY    . CERVICAL SPINE SURGERY    . CESAREAN SECTION    . CHOLECYSTECTOMY    . COLONOSCOPY WITH PROPOFOL N/A 07/29/2015   Procedure: COLONOSCOPY WITH PROPOFOL;  Surgeon: Hulen Luster, MD;  Location: [facility] ENDOSCOPY;  Service: Gastroenterology;  Laterality: N/A;  . TUBAL LIGATION        Current Facility-Administered Medications:  .  HYDROcodone-acetaminophen (NORCO) 7.5-325 MG per tablet 1 tablet, 1 tablet, Oral, Q6H PRN, Mohammed Kindle, MD .  HYDROcodone-acetaminophen ([place]) 7.5-325  MG per tablet 1 tablet, 1 tablet, Oral, Q6H PRN, Mohammed Kindle, MD  Current Outpatient Medications:  .  Carbidopa-Levodopa ER (SINEMET CR) 25-100 MG tablet controlled release, Take by mouth., Disp: , Rfl:  .  citalopram (CELEXA) 20 MG tablet, Take 20 mg by mouth daily., Disp: , Rfl:  .  HYDROcodone-acetaminophen (NORCO) 7.5-325 MG tablet, Limit one half to one tab by mouth per day or twice per day if tolerated, Disp: 40 tablet, Rfl: 0 .  Multiple Vitamins-Minerals (MULTIVITAMIN ADULT PO), Take by mouth., Disp: , Rfl:  .  nortriptyline (PAMELOR) 25 MG capsule, Take 25 mg by mouth at bedtime., Disp: , Rfl:  .  pramipexole (MIRAPEX) 0.5 MG tablet, Take 0.5 mg by mouth 3 (three) times daily., Disp: , Rfl:  .  nitrofurantoin, macrocrystal-monohydrate, (MACROBID) 100 MG capsule, Take 1 capsule (100 mg total) by mouth 2 (two) times daily for 7 days., Disp: 14 capsule, Rfl: 0  Allergies Sulfa antibiotics and Demerol [meperidine]  Family History  Problem Relation Age of Onset  . Arthritis Mother   . Depression Mother   . Diabetes Mother   . Early death Mother   . Varicose Veins Mother   . Heart disease Father   . Hypertension Father   . Arthritis Sister   . Depression Sister   . Drug abuse Sister   . Hypertension Maternal Grandmother   . Stroke Maternal Grandmother   . Varicose Veins Maternal Grandmother   . Diabetes  Paternal Grandmother   . Arthritis Paternal Grandmother   . Depression Son   . Diabetes Maternal Aunt   . Depression Maternal Aunt     Social History Social History   Tobacco Use  . Smoking status: Former Research scientist (life sciences)  . Smokeless tobacco: Former Systems developer    Quit date: 07/10/1974  Substance Use Topics  . Alcohol use: No    Alcohol/week: 0.0 standard drinks  . Drug use: No    Review of Systems Constitutional: No fever Cardiovascular: Denies chest pain. Respiratory: Denies shortness of breath. Gastrointestinal: No abdominal pain.   Genitourinary: positive for dysuria.  Musculoskeletal: Negative for back pain. Skin: Negative for rash.   ____________________________________________   PHYSICAL EXAM:  VITAL SIGNS: ED Triage Vitals  Enc Vitals Group     BP 04/29/19 1356 116/77     Pulse Rate 04/29/19 1356 84     Resp 04/29/19 1356 16     Temp 04/29/19 1356 99 F (37.2 C)     Temp Source 04/29/19 1356 Oral     SpO2 04/29/19 1356 97 %     Weight 04/29/19 1352 154 lb (69.9 kg)     Height 04/29/19 1352 5' (1.524 m)     Head Circumference --      Peak Flow --      Pain Score 04/29/19 1352 3     Pain Loc --      Pain Edu? --      Excl. in [place]? --     Constitutional: Alert and oriented. Well appearing and in no acute distress. ENT      Head: Normocephalic and atraumatic. Cardiovascular: Normal rate, regular rhythm. Grossly normal heart sounds.  Good peripheral circulation. Respiratory: Normal respiratory effort without tachypnea nor retractions. Breath sounds are clear and equal bilaterally. No wheezes, rales, rhonchi. Gastrointestinal: Soft and nontender.  No CVA tenderness. Musculoskeletal:  No midline cervical, thoracic or lumbar tenderness to palpation.  Neurologic:  Normal speech and language. Speech is normal. No gait instability.  Skin:  Skin is warm, dry and intact. No rash noted. Psychiatric: Mood and affect are normal. Speech and behavior are normal. Patient exhibits appropriate insight and judgment   ___________________________________________   LABS (all labs ordered are listed, but only abnormal results are displayed)  Labs Reviewed  URINALYSIS, COMPLETE (UACMP) WITH MICROSCOPIC - Abnormal; Notable for the following components:      Result Value   Specific Gravity, Urine >1.030 (*)    Hgb urine dipstick SMALL (*)    Bilirubin Urine SMALL (*)    Ketones, ur 15 (*)    Protein, ur TRACE (*)    Leukocytes,Ua SMALL (*)    All other components within normal limits  URINE CULTURE   ____________________________________________    PROCEDURES Procedures   INITIAL IMPRESSION / ASSESSMENT AND PLAN / ED COURSE  Pertinent labs & imaging results that were available during my care of the patient were reviewed by me and considered in my medical decision making (see chart for details).  Well-appearing patient.  No acute distress.  Urinalysis reviewed, suspect UTI.  We will culture.  Will start treatment with oral Macrobid as most recently treated with Keflex.  Encourage rest, fluids, supportive care and monitoring.Discussed indication, risks and benefits of medications with patient.  Discussed follow up with Primary care physician this week. Discussed follow up and return parameters including no resolution or any worsening concerns. Patient verbalized understanding and agreed to plan.   ____________________________________________   FINAL CLINICAL IMPRESSION(S) /  ED DIAGNOSES  Final diagnoses:  Dysuria     ED Discharge Orders         Ordered    nitrofurantoin, macrocrystal-monohydrate, (MACROBID) 100 MG capsule  2 times daily     04/29/19 1428           Note: This dictation was prepared with Dragon dictation along with smaller phrase technology. Any transcriptional errors that result from this process are unintentional.         Marylene Land, NP 04/29/19 1511

## 2019-04-30 LAB — URINE CULTURE: Culture: <10000 — AB

## 2020-03-19 ENCOUNTER — Ambulatory Visit
Admission: EM | Admit: 2020-03-19 | Discharge: 2020-03-19 | Disposition: A | Discharge status: Home / Self Care | Payer: Medicare Other | Attending: Family Medicine | Admitting: Family Medicine

## 2020-03-19 ENCOUNTER — Other Ambulatory Visit: Payer: Self-pay

## 2020-03-19 DIAGNOSIS — N390 Urinary tract infection, site not specified: Secondary | ICD-10-CM | POA: Diagnosis not present

## 2020-03-19 LAB — URINALYSIS, COMPLETE (UACMP) WITH MICROSCOPIC: pH: 5 (ref 5.0–8.0)

## 2020-03-19 MED ORDER — CEPHALEXIN 500 MG PO CAPS: 500.0000 mg | ORAL_CAPSULE | Freq: Two times a day (BID) | ORAL | 0 refills | Status: DC

## 2020-03-19 NOTE — ED Triage Notes (Signed)
Patient complains of urinary urgency, frequency and incontinence, hematuria and urinary burning since yesterday.  States that she has taken AZO this morning.

## 2020-03-19 NOTE — ED Provider Notes (Signed)
[facility]    CSN: [id number] Arrival date & time: 03/19/20  0815      History   Chief Complaint Chief Complaint  Patient presents with  . Urinary Frequency    HPI Jordan Duran is a 64 y.o. female.   HPI  64 year old female presents with  urgency frequency, incontinence and dysuria along with hematuria.  Empty him started yesterday.  She has been taking Azo this morning because of the dysuria.  She related to me that she has frequent UTIs due to previous excision of a melanoma requiring of partial vulvectomy.  She has been told that this would make her prone to frequent urinary tract infections.  Nuys any nausea vomiting fever or chills.  She has had no vaginal discharge.      Past Medical History:  Diagnosis Date  . Arthritis   . Cancer ([place])    Melanoma of the Skin  . Depression   . Fracture of L1 vertebra ([place])   . Fracture of T12 vertebra ([place])   . Heart murmur   . Hyperlipidemia   . Parkinson disease ([place])   . Recurrent UTI     Patient Active Problem List   Diagnosis Date Noted  . DDD (degenerative disc disease), lumbar 03/10/2015  . DJD of shoulder 03/10/2015  . Greater trochanteric bursitis of both hips 03/10/2015  . Degenerative joint disease of sacroiliac joint ([place]) 03/10/2015    Past Surgical History:  Procedure Laterality Date  . ABDOMINAL HYSTERECTOMY    . CERVICAL SPINE SURGERY    . CESAREAN SECTION    . CHOLECYSTECTOMY    . COLONOSCOPY WITH PROPOFOL N/A 07/29/2015   Procedure: COLONOSCOPY WITH PROPOFOL;  Surgeon: Hulen Luster, MD;  Location: [facility] ENDOSCOPY;  Service: Gastroenterology;  Laterality: N/A;  . TUBAL LIGATION      OB History   No obstetric history on file.      Home Medications    Prior to Admission medications   Medication Sig Start Date End Date Taking? Authorizing Provider  Carbidopa-Levodopa ER (SINEMET CR) 25-100 MG tablet controlled release Take by mouth. 09/05/18 03/19/20 Yes [provider]    citalopram (CELEXA) 20 MG tablet Take 20 mg by mouth daily.   Yes [provider]  gabapentin (NEURONTIN) 100 MG capsule Take 300 mg by mouth at bedtime. 01/24/20  Yes [provider]  HYDROcodone-acetaminophen (NORCO) 7.5-325 MG tablet Limit one half to one tab by mouth per day or twice per day if tolerated 06/14/16  Yes Mohammed Kindle, MD  methocarbamol (ROBAXIN) 500 MG tablet Take 500 mg by mouth 2 (two) times daily. 01/13/20  Yes [provider]  Multiple Vitamins-Minerals (MULTIVITAMIN ADULT PO) Take by mouth.   Yes [provider]  naloxone (NARCAN) 4 MG/0.1ML LIQD nasal spray kit Narcan 4 mg/actuation nasal spray   Yes [provider]  nortriptyline (PAMELOR) 25 MG capsule Take 25 mg by mouth at bedtime.   Yes [provider]  YUVAFEM 10 MCG TABS vaginal tablet Place 1 tablet vaginally 2 (two) times a week. 03/14/20  Yes [provider]  cephALEXin (KEFLEX) 500 MG capsule Take 1 capsule (500 mg total) by mouth 2 (two) times daily. 03/19/20   Lorin Picket, PA-C  pramipexole (MIRAPEX) 0.5 MG tablet Take 0.5 mg by mouth 3 (three) times daily.  03/19/20  [provider]    Family History Family History  Problem Relation Age of Onset  . Arthritis Mother   . Depression  Mother   . Diabetes Mother   . Early death Mother   . Varicose Veins Mother   . Heart disease Father   . Hypertension Father   . Arthritis Sister   . Depression Sister   . Drug abuse Sister   . Hypertension Maternal Grandmother   . Stroke Maternal Grandmother   . Varicose Veins Maternal Grandmother   . Diabetes Paternal Grandmother   . Arthritis Paternal Grandmother   . Depression Son   . Diabetes Maternal Aunt   . Depression Maternal Aunt     Social History Social History   Tobacco Use  . Smoking status: Former Research scientist (life sciences)  . Smokeless tobacco: Former Systems developer    Quit date: 07/10/1974  Substance Use Topics  . Alcohol use: No    Alcohol/week:  0.0 standard drinks  . Drug use: No     Allergies   Sulfa antibiotics and Demerol [meperidine]   Review of Systems Review of Systems  Constitutional: Positive for activity change. Negative for appetite change, chills, diaphoresis, fatigue and fever.  Genitourinary: Positive for dysuria, frequency, hematuria and urgency. Negative for vaginal bleeding, vaginal discharge and vaginal pain.  All other systems reviewed and are negative.    Physical Exam Triage Vital Signs ED Triage Vitals  Enc Vitals Group     BP 03/19/20 0833 132/70     Pulse Rate 03/19/20 0833 86     Resp 03/19/20 0833 18     Temp 03/19/20 0833 98.2 F (36.8 C)     Temp Source 03/19/20 0833 Oral     SpO2 03/19/20 0833 94 %     Weight 03/19/20 0830 157 lb (71.2 kg)     Height 03/19/20 0830 5' (1.524 m)     Head Circumference --      Peak Flow --      Pain Score 03/19/20 0829 7     Pain Loc --      Pain Edu? --      Excl. in [place]? --    No data found.  Updated Vital Signs BP 132/70 (BP Location: Left Arm)   Pulse 86   Temp 98.2 F (36.8 C) (Oral)   Resp 18   Ht 5' (1.524 m)   Wt 157 lb (71.2 kg)   SpO2 94%   BMI 30.66 kg/m   Visual Acuity Right Eye Distance:   Left Eye Distance:   Bilateral Distance:    Right Eye Near:   Left Eye Near:    Bilateral Near:     Physical Exam Vitals and nursing note reviewed.  Constitutional:      General: She is not in acute distress.    Appearance: Normal appearance. She is not ill-appearing or toxic-appearing.  HENT:     Head: Normocephalic and atraumatic.  Eyes:     Conjunctiva/sclera: Conjunctivae normal.  Pulmonary:     Effort: Pulmonary effort is normal.     Breath sounds: Normal breath sounds.  Abdominal:     General: Abdomen is flat. Bowel sounds are normal. There is no distension.     Tenderness: There is no abdominal tenderness. There is no right CVA tenderness, left CVA tenderness, guarding or rebound.  Musculoskeletal:        General:  Normal range of motion.     Cervical back: Normal range of motion and neck supple.  Skin:    General: Skin is warm and dry.  Neurological:     General: No focal deficit present.  Mental Status: She is alert and oriented to person, place, and time.  Psychiatric:        Mood and Affect: Mood normal.        Behavior: Behavior normal.        Thought Content: Thought content normal.        Judgment: Judgment normal.      UC Treatments / Results  Labs (all labs ordered are listed, but only abnormal results are displayed) Labs Reviewed  URINALYSIS, COMPLETE (UACMP) WITH MICROSCOPIC - Abnormal; Notable for the following components:      Result Value   Color, Urine ORANGE (*)    APPearance HAZY (*)    Glucose, UA   (*)    Value: TEST NOT REPORTED DUE TO COLOR INTERFERENCE OF URINE PIGMENT   Hgb urine dipstick   (*)    Value: TEST NOT REPORTED DUE TO COLOR INTERFERENCE OF URINE PIGMENT   Bilirubin Urine   (*)    Value: TEST NOT REPORTED DUE TO COLOR INTERFERENCE OF URINE PIGMENT   Ketones, ur   (*)    Value: TEST NOT REPORTED DUE TO COLOR INTERFERENCE OF URINE PIGMENT   Protein, ur   (*)    Value: TEST NOT REPORTED DUE TO COLOR INTERFERENCE OF URINE PIGMENT   Nitrite   (*)    Value: TEST NOT REPORTED DUE TO COLOR INTERFERENCE OF URINE PIGMENT   Leukocytes,Ua   (*)    Value: TEST NOT REPORTED DUE TO COLOR INTERFERENCE OF URINE PIGMENT   Bacteria, UA FEW (*)    All other components within normal limits  URINE CULTURE    EKG   Radiology No results found.  Procedures Procedures (including critical care time)  Medications Ordered in UC Medications - No data to display  Initial Impression / Assessment and Plan / UC Course  I have reviewed the triage vital signs and the nursing notes.  Pertinent labs & imaging results that were available during my care of the patient were reviewed by me and considered in my medical decision making (see chart for details).   64 year old  female presents with a 1 day history of urinary urgency frequency and incontinence, dysuria described as burning and hematuria that started this morning.  She has been taking Azo which invalidated dipstick urinalysis.  However, Microscopic analysis showed granular casts 6-10 RBCs WBC clumps with 11-20 WBCs.  I will culture her urine.  She will be placed on 7 days of Keflex 500 mg twice daily.  She will continue using Azo for discomfort.  She was cautioned that if she developed any nausea vomiting fever chills or any changes she should go to the emergency room or return to our clinic.   Final Clinical Impressions(s) / UC Diagnoses   Final diagnoses:  Lower urinary tract infectious disease   Discharge Instructions   None    ED Prescriptions    Medication Sig Dispense Auth. Provider   cephALEXin (KEFLEX) 500 MG capsule Take 1 capsule (500 mg total) by mouth 2 (two) times daily. 14 capsule Lorin Picket, PA-C     PDMP not reviewed this encounter.   Lorin Picket, PA-C 03/19/20 [phone]

## 2020-03-22 LAB — URINE CULTURE: Culture: >=100000 — AB

## 2020-04-04 ENCOUNTER — Ambulatory Visit
Admission: EM | Admit: 2020-04-04 | Discharge: 2020-04-04 | Disposition: A | Discharge status: Home / Self Care | Payer: Medicare Other | Attending: Family Medicine | Admitting: Family Medicine

## 2020-04-04 ENCOUNTER — Other Ambulatory Visit: Payer: Self-pay

## 2020-04-04 DIAGNOSIS — N3000 Acute cystitis without hematuria: Secondary | ICD-10-CM | POA: Insufficient documentation

## 2020-04-04 LAB — URINALYSIS, COMPLETE (UACMP) WITH MICROSCOPIC
Bilirubin Urine: NEGATIVE
Glucose, UA: NEGATIVE mg/dL
Hgb urine dipstick: NEGATIVE
Nitrite: NEGATIVE
Protein, ur: NEGATIVE mg/dL
Specific Gravity, Urine: >1.03 — ABNORMAL HIGH (ref 1.005–1.030)
WBC, UA: >50 WBC/hpf (ref 0–5)
pH: 5 (ref 5.0–8.0)

## 2020-04-04 MED ORDER — CEFDINIR 300 MG PO CAPS: 300.0000 mg | ORAL_CAPSULE | Freq: Two times a day (BID) | ORAL | 0 refills | Status: DC

## 2020-04-04 MED ORDER — NITROFURANTOIN MONOHYD MACRO 100 MG PO CAPS: 100.0000 mg | ORAL_CAPSULE | Freq: Every day | ORAL | 1 refills | Status: DC | PRN

## 2020-04-04 NOTE — Discharge Instructions (Signed)
Antibiotic as prescribed.  Take care  Dr. Ameilia Rattan  

## 2020-04-04 NOTE — ED Triage Notes (Signed)
Pt states she was just treated for a UTI and finished ABX last week but now "UTI is back". Complains of burning with urination.

## 2020-04-04 NOTE — ED Provider Notes (Signed)
[facility]    CSN: [id number] Arrival date & time: 04/04/20  1210      History   Chief Complaint Chief Complaint  Patient presents with  . Dysuria   HPI  64 year old female with a history of recurrent UTI presents for evaluation of dysuria.  Patient reports that she has now developed recurrent symptoms of dysuria.  She is concerned that she has another urinary tract infection.  Patient recently seen and treated on 5/22.  Culture revealed Klebsiella.  Was treated with Keflex.  Patient denies fever.  No abdominal pain.  No other associated symptoms.  No other complaints.  Past Medical History:  Diagnosis Date  . Arthritis   . Cancer ([place])    Melanoma of the Skin  . Depression   . Fracture of L1 vertebra ([place])   . Fracture of T12 vertebra ([place])   . Heart murmur   . Hyperlipidemia   . Parkinson disease ([place])   . Recurrent UTI     Patient Active Problem List   Diagnosis Date Noted  . DDD (degenerative disc disease), lumbar 03/10/2015  . DJD of shoulder 03/10/2015  . Greater trochanteric bursitis of both hips 03/10/2015  . 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 ABDOMINAL HYSTERECTOMY    . CERVICAL SPINE SURGERY    . CESAREAN SECTION    . CHOLECYSTECTOMY    . COLONOSCOPY WITH PROPOFOL N/A 07/29/2015   Procedure: COLONOSCOPY WITH PROPOFOL;  Surgeon: Hulen Luster, MD;  Location: [facility] ENDOSCOPY;  Service: Gastroenterology;  Laterality: N/A;  . TUBAL LIGATION      OB History   No obstetric history on file.      Home Medications    Prior to Admission medications   Medication Sig Start Date End Date Taking? Authorizing Provider  Carbidopa-Levodopa ER (SINEMET CR) 25-100 MG tablet controlled release Take 1.5 tablets by mouth.  09/05/18 03/19/20  [provider]  cefdinir (OMNICEF) 300 MG capsule Take 1 capsule (300 mg total) by mouth 2 (two) times daily. 04/04/20   Coral Spikes, DO  citalopram (CELEXA) 20 MG tablet Take 20 mg by mouth daily.    [provider]  gabapentin (NEURONTIN) 100 MG capsule Take 300 mg by mouth at bedtime. 01/24/20   [provider]  HYDROcodone-acetaminophen (NORCO) 7.5-325 MG tablet Limit one half to one tab by mouth per day or twice per day if tolerated 06/14/16   Mohammed Kindle, MD  Multiple Vitamins-Minerals (MULTIVITAMIN ADULT PO) Take by mouth.    [provider]  naloxone (NARCAN) 4 MG/0.1ML LIQD nasal spray kit Narcan 4 mg/actuation nasal spray    [provider]  nitrofurantoin, macrocrystal-monohydrate, (MACROBID) 100 MG capsule Take 1 capsule (100 mg total) by mouth daily as needed (For prophylaxis; take after intercourse). 04/04/20   Coral Spikes, DO  nortriptyline (PAMELOR) 25 MG capsule Take 25 mg by mouth at bedtime.    [provider]  YUVAFEM 10 MCG TABS vaginal tablet Place 1 tablet vaginally 2 (two) times a week. 03/14/20   [provider]  pramipexole (MIRAPEX) 0.5 MG tablet Take 0.5 mg by mouth 3 (three) times daily.  03/19/20  [provider]    Family History Family History  Problem Relation Age of Onset  . Arthritis Mother   . Depression Mother   . Diabetes Mother   . Early death Mother   . Varicose Veins Mother   .  Heart disease Father   . Hypertension Father   . Arthritis Sister   . Depression Sister   . Drug abuse Sister   . Hypertension Maternal Grandmother   . Stroke Maternal Grandmother   . Varicose Veins Maternal Grandmother   . Diabetes Paternal Grandmother   . Arthritis Paternal Grandmother   . Depression Son   . Diabetes Maternal Aunt   . Depression Maternal Aunt     Social History Social History   Tobacco Use  . Smoking status: Former Research scientist (life sciences)  . Smokeless tobacco: Former Systems developer    Quit date: 07/10/1974  Substance Use Topics  . Alcohol use: No    Alcohol/week: 0.0 standard drinks  . Drug use: No     Allergies   Sulfa  antibiotics and Demerol [meperidine]   Review of Systems Review of Systems  Constitutional: Negative for fever.  Genitourinary: Positive for dysuria.   Physical Exam Triage Vital Signs ED Triage Vitals  Enc Vitals Group     BP 04/04/20 1224 (!) 129/91     Pulse Rate 04/04/20 1224 97     Resp 04/04/20 1224 18     Temp 04/04/20 1224 98.1 F (36.7 C)     Temp Source 04/04/20 1224 Oral     SpO2 04/04/20 1224 98 %     Weight 04/04/20 1220 155 lb (70.3 kg)     Height 04/04/20 1220 5' (1.524 m)     Head Circumference --      Peak Flow --      Pain Score 04/04/20 1219 4     Pain Loc --      Pain Edu? --      Excl. in [place]? --    No data found.  Updated Vital Signs BP (!) 129/91 (BP Location: Left Arm)   Pulse 97   Temp 98.1 F (36.7 C) (Oral)   Resp 18   Ht 5' (1.524 m)   Wt 70.3 kg   SpO2 98%   BMI 30.27 kg/m   Visual Acuity Right Eye Distance:   Left Eye Distance:   Bilateral Distance:    Right Eye Near:   Left Eye Near:    Bilateral Near:     Physical Exam Constitutional:      General: She is not in acute distress.    Appearance: Normal appearance. She is not ill-appearing.  HENT:     Head: Normocephalic and atraumatic.  Eyes:     General:        Right eye: No discharge.        Left eye: No discharge.     Conjunctiva/sclera: Conjunctivae normal.  Cardiovascular:     Rate and Rhythm: Normal rate and regular rhythm.     Heart sounds: No murmur.  Pulmonary:     Effort: Pulmonary effort is normal.     Breath sounds: No wheezing, rhonchi or rales.  Abdominal:     General: There is no distension.     Palpations: Abdomen is soft.     Tenderness: There is no abdominal tenderness.  Neurological:     Mental Status: She is alert.    UC Treatments / Results  Labs (all labs ordered are listed, but only abnormal results are displayed) Labs Reviewed  URINALYSIS, COMPLETE (UACMP) WITH MICROSCOPIC - Abnormal; Notable for the following components:      Result  Value   APPearance CLOUDY (*)    Specific Gravity, Urine >1.030 (*)    Ketones, ur TRACE (*)  Leukocytes,Ua SMALL (*)    Bacteria, UA RARE (*)    All other components within normal limits    EKG   Radiology No results found.  Procedures Procedures (including critical care time)  Medications Ordered in UC Medications - No data to display  Initial Impression / Assessment and Plan / UC Course  I have reviewed the triage vital signs and the nursing notes.  Pertinent labs & imaging results that were available during my care of the patient were reviewed by me and considered in my medical decision making (see chart for details).    64 year old female presents with UTI.  This is a recurrent problem.  Placed on [place].  Sending culture.  Patient also requested refill of prophylactic Macrobid.  Rx sent.  Final Clinical Impressions(s) / UC Diagnoses   Final diagnoses:  Acute cystitis without hematuria     Discharge Instructions     Antibiotic as prescribed.  Take care  Dr. Lacinda Axon    ED Prescriptions    Medication Sig Dispense Auth. Provider   cefdinir (OMNICEF) 300 MG capsule  (Status: Discontinued) Take 1 capsule (300 mg total) by mouth 2 (two) times daily. 14 capsule Abhi Moccia G, DO   cefdinir (OMNICEF) 300 MG capsule  (Status: Discontinued) Take 1 capsule (300 mg total) by mouth 2 (two) times daily. Please disregard previous Rx for 7 day supply 20 capsule Elsie Baynes G, DO   nitrofurantoin, macrocrystal-monohydrate, (MACROBID) 100 MG capsule  (Status: Discontinued) Take 1 capsule (100 mg total) by mouth daily as needed (For prophylaxis; take after intercourse). 30 capsule Kinzi Frediani G, DO   nitrofurantoin, macrocrystal-monohydrate, (MACROBID) 100 MG capsule Take 1 capsule (100 mg total) by mouth daily as needed (For prophylaxis; take after intercourse). 30 capsule Lacee Grey G, DO   cefdinir (OMNICEF) 300 MG capsule Take 1 capsule (300 mg total) by mouth 2 (two) times  daily. 20 capsule Coral Spikes, DO     PDMP not reviewed this encounter.   Coral Spikes, [place] 04/04/20 2229

## 2021-02-20 ENCOUNTER — Other Ambulatory Visit: Payer: Self-pay

## 2021-02-20 ENCOUNTER — Ambulatory Visit
Admission: EM | Admit: 2021-02-20 | Discharge: 2021-02-20 | Disposition: A | Discharge status: Home / Self Care | Payer: Medicare Other

## 2021-02-20 DIAGNOSIS — H8111 Benign paroxysmal vertigo, right ear: Secondary | ICD-10-CM | POA: Diagnosis not present

## 2021-02-20 DIAGNOSIS — R42 Dizziness and giddiness: Secondary | ICD-10-CM

## 2021-02-20 DIAGNOSIS — R112 Nausea with vomiting, unspecified: Secondary | ICD-10-CM

## 2021-02-20 DIAGNOSIS — G20A1 Parkinson's disease without dyskinesia, without mention of fluctuations: Secondary | ICD-10-CM

## 2021-02-20 DIAGNOSIS — G20 Parkinson's disease: Secondary | ICD-10-CM | POA: Diagnosis not present

## 2021-02-20 MED ORDER — MECLIZINE HCL 25 MG PO TABS: 25.0000 mg | ORAL_TABLET | Freq: Three times a day (TID) | ORAL | 0 refills | Status: DC | PRN

## 2021-02-20 MED ORDER — ONDANSETRON 8 MG PO TBDP: 8.0000 mg | ORAL_TABLET | Freq: Three times a day (TID) | ORAL | 0 refills | Status: AC | PRN

## 2021-02-20 MED ORDER — MECLIZINE HCL 25 MG PO TABS: 25.0000 mg | ORAL_TABLET | Freq: Three times a day (TID) | ORAL | 0 refills | Status: AC | PRN

## 2021-02-20 MED ORDER — ONDANSETRON 8 MG PO TBDP: 8.0000 mg | ORAL_TABLET | Freq: Three times a day (TID) | ORAL | 0 refills | Status: DC | PRN

## 2021-02-20 MED ORDER — ONDANSETRON 8 MG PO TBDP
8.0000 mg | ORAL_TABLET | Freq: Once | ORAL | Status: AC
  Administered 2021-02-20: 8 mg via ORAL

## 2021-02-20 NOTE — ED Provider Notes (Signed)
[facility]    CSN: [id number] Arrival date & time: 02/20/21  1745      History   Chief Complaint Chief Complaint  Patient presents with  . Dizziness    HPI Jordan Duran is a 65 y.o. female presenting for intermittent episodes of dizziness, nausea and vomiting.  Patient states that she had an episode on Friday, Saturday and Sunday each lasting a couple minutes.  She says that today she has had the symptoms for the past hour and a half and they have not improved.  She has vomited multiple times.  She says that any movement of her head or position changes cause her increased dizziness.  She denies any vision changes.  She has not had any headaches.  No numbness, weakness or tingling.  She does not report any limb weakness but says that her gait is off because she feels so dizzy.  She has not had any chest pain, palpitations, or breathing difficulty.  Patient admits to similar problem in the past that was treated with Antivert and improved.  Patient does have history of Parkinson's disease.  She has no history of stroke.  Her husband is denying any confusion or memory issues.  Denies any behavior changes.  They state she is hydrating well.  She has not been sick recently.  Patient has no other concerns.  HPI  Past Medical History:  Diagnosis Date  . Arthritis   . Cancer ([place])    Melanoma of the Skin  . Depression   . Fracture of L1 vertebra ([place])   . Fracture of T12 vertebra ([place])   . Heart murmur   . Hyperlipidemia   . Parkinson disease ([place])   . Recurrent UTI     Patient Active Problem List   Diagnosis Date Noted  . DDD (degenerative disc disease), lumbar 03/10/2015  . DJD of shoulder 03/10/2015  . Greater trochanteric bursitis of both hips 03/10/2015  . 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 ABDOMINAL HYSTERECTOMY    . CERVICAL SPINE SURGERY    . CESAREAN SECTION    .  CHOLECYSTECTOMY    . COLONOSCOPY WITH PROPOFOL N/A 07/29/2015   Procedure: COLONOSCOPY WITH PROPOFOL;  Surgeon: Hulen Luster, MD;  Location: [facility] ENDOSCOPY;  Service: Gastroenterology;  Laterality: N/A;  . TUBAL LIGATION      OB History   No obstetric history on file.      Home Medications    Prior to Admission medications   Medication Sig Start Date End Date Taking? Authorizing Provider  Carbidopa-Levodopa ER (SINEMET CR) 25-100 MG tablet controlled release Take 1.5 tablets by mouth.  09/05/18 02/20/21 Yes [provider]  citalopram (CELEXA) 20 MG tablet Take 20 mg by mouth daily.   Yes [provider]  gabapentin (NEURONTIN) 100 MG capsule Take 300 mg by mouth at bedtime. 01/24/20  Yes [provider]  HYDROcodone-acetaminophen ([place]) 7.5-325 MG tablet Limit one half to one tab by mouth per day or twice per day if tolerated 06/14/16  Yes Mohammed Kindle, MD  Multiple Vitamins-Minerals (MULTIVITAMIN ADULT PO) Take by mouth.   Yes [provider]  naloxone (NARCAN) 4 MG/0.1ML LIQD nasal spray kit Narcan 4 mg/actuation nasal spray   Yes [provider]  nortriptyline (PAMELOR) 25 MG capsule Take 25 mg by mouth at bedtime.   Yes [provider]  tiZANidine (ZANAFLEX) 2 MG tablet Take by mouth  at bedtime.   Yes [provider]  YUVAFEM 10 MCG TABS vaginal tablet Place 1 tablet vaginally 2 (two) times a week. 03/14/20  Yes [provider]  cefdinir (OMNICEF) 300 MG capsule Take 1 capsule (300 mg total) by mouth 2 (two) times daily. 04/04/20   Coral Spikes, DO  meclizine (ANTIVERT) 25 MG tablet Take 1 tablet (25 mg total) by mouth 3 (three) times daily as needed for up to 7 days for dizziness. 02/20/21 02/27/21  Danton Clap, PA-C  nitrofurantoin, macrocrystal-monohydrate, (MACROBID) 100 MG capsule Take 1 capsule (100 mg total) by mouth daily as needed (For prophylaxis; take after intercourse). 04/04/20   Coral Spikes, DO   ondansetron (ZOFRAN ODT) 8 MG disintegrating tablet Take 1 tablet (8 mg total) by mouth every 8 (eight) hours as needed for up to 7 days for nausea or vomiting. 02/20/21 02/27/21  Laurene Footman B, PA-C  pramipexole (MIRAPEX) 0.5 MG tablet Take 0.5 mg by mouth 3 (three) times daily.  03/19/20  [provider]    Family History Family History  Problem Relation Age of Onset  . Arthritis Mother   . Depression Mother   . Diabetes Mother   . Early death Mother   . Varicose Veins Mother   . Heart disease Father   . Hypertension Father   . Arthritis Sister   . Depression Sister   . Drug abuse Sister   . Hypertension Maternal Grandmother   . Stroke Maternal Grandmother   . Varicose Veins Maternal Grandmother   . Diabetes Paternal Grandmother   . Arthritis Paternal Grandmother   . Depression Son   . Diabetes Maternal Aunt   . Depression Maternal Aunt     Social History Social History   Tobacco Use  . Smoking status: Former Research scientist (life sciences)  . Smokeless tobacco: Former Systems developer    Quit date: 07/10/1974  Vaping Use  . Vaping Use: Never used  Substance Use Topics  . Alcohol use: No    Alcohol/week: 0.0 standard drinks  . Drug use: No     Allergies   Sulfa antibiotics and Demerol [meperidine]   Review of Systems Review of Systems  Constitutional: Negative for chills, diaphoresis, fatigue and fever.  HENT: Negative for congestion, ear pain, rhinorrhea, sinus pressure, sinus pain and sore throat.   Respiratory: Negative for cough and shortness of breath.   Cardiovascular: Negative for chest pain.  Gastrointestinal: Positive for nausea and vomiting. Negative for abdominal pain.  Musculoskeletal: Negative for arthralgias and myalgias.  Skin: Negative for rash.  Neurological: Positive for dizziness and tremors. Negative for seizures, syncope, facial asymmetry, speech difficulty, weakness, numbness and headaches.  Hematological: Negative for adenopathy.  Psychiatric/Behavioral: Negative  for confusion.     Physical Exam Triage Vital Signs ED Triage Vitals  Enc Vitals Group     BP 02/20/21 1835 118/80     Pulse Rate 02/20/21 1835 88     Resp 02/20/21 1835 18     Temp 02/20/21 1835 97.9 F (36.6 C)     Temp Source 02/20/21 1835 Oral     SpO2 02/20/21 1835 95 %     Weight --      Height --      Head Circumference --      Peak Flow --      Pain Score 02/20/21 1828 0     Pain Loc --      Pain Edu? --      Excl. in [place]? --  No data found.  Updated Vital Signs BP 118/80 (BP Location: Right Arm)   Pulse 88   Temp 97.9 F (36.6 C) (Oral)   Resp 18   SpO2 95%       Physical Exam Vitals and nursing note reviewed.  Constitutional:      General: She is not in acute distress.    Appearance: Normal appearance. She is not ill-appearing or toxic-appearing.     Comments: Resting tremors of hands consistent with PD  HENT:     Head: Normocephalic and atraumatic.     Right Ear: Tympanic membrane, ear canal and external ear normal.     Left Ear: Tympanic membrane, ear canal and external ear normal.     Nose: Nose normal.     Mouth/Throat:     Mouth: Mucous membranes are moist.     Pharynx: Oropharynx is clear.  Eyes:     General: No scleral icterus.       Right eye: No discharge.        Left eye: No discharge.     Extraocular Movements: Extraocular movements intact.     Conjunctiva/sclera: Conjunctivae normal.     Pupils: Pupils are equal, round, and reactive to light.  Cardiovascular:     Rate and Rhythm: Normal rate and regular rhythm.     Heart sounds: Normal heart sounds.  Pulmonary:     Effort: Pulmonary effort is normal. No respiratory distress.     Breath sounds: Normal breath sounds. No wheezing, rhonchi or rales.  Abdominal:     Palpations: Abdomen is soft.     Tenderness: There is no abdominal tenderness.  Musculoskeletal:     Cervical back: Neck supple.  Skin:    General: Skin is dry.  Neurological:     General: No focal deficit present.      Mental Status: She is alert and oriented to person, place, and time. Mental status is at baseline.     Cranial Nerves: No cranial nerve deficit, dysarthria or facial asymmetry.     Sensory: No sensory deficit.     Motor: No weakness.     Gait: Gait abnormal (shuffling gait).     Comments: Horizontal nystagmus when turning head to the right. No nystagmus with head movement to the left.  5/5 strength bilat UEs and LEs  Psychiatric:        Mood and Affect: Mood normal.        Behavior: Behavior normal.        Thought Content: Thought content normal.      UC Treatments / Results  Labs (all labs ordered are listed, but only abnormal results are displayed) Labs Reviewed - No data to display  EKG   Radiology No results found.  Procedures Procedures (including critical care time)  Medications Ordered in UC Medications  ondansetron (ZOFRAN-ODT) disintegrating tablet 8 mg (8 mg Oral Given 02/20/21 1920)    Initial Impression / Assessment and Plan / UC Course  I have reviewed the triage vital signs and the nursing notes.  Pertinent labs & imaging results that were available during my care of the patient were reviewed by me and considered in my medical decision making (see chart for details).   65 year old female with Parkinson's disease presenting for severe vertigo/dizziness with associated nausea and vomiting.  Vertigo dizziness provoked with head movement to the right especially.  She also has nystagmus when looking to the right.  She does have gait abnormality that she says is  normal but is worse since she has been dizzy.  Cranial nerve exam normal.  5 out of 5 strength of bilateral upper and lower extremities.  Advised patient that symptoms most consistent with BPPV.  Patient says that she has had vertigo before that that did improve with meclizine and request that medication.  I did review that there are many other causes of dizziness with her and I am concerned since she has had  severe dizziness episode for the last hour and a half that is not improving on its own.  It is also associated with nausea and vomiting.  Patient already has abnormal neurological exam due to your Parkinson's disease, so I advised I cannot 100% rule out an intracranial abnormality or stroke.  I did advise patient that we could do some basic labs and EKG as well as check a urinalysis here, but she declined.  She also declines to go to the emergency department at this time.  She request to take the meclizine and try the Epley maneuver at home herself.  She also request nausea medications I sent Zofran for her.  Patient's husband was present so I advised him of the importance of going to the ED if any symptoms worsen or she is not improving throughout the night or in the morning.  Patient and husband agree to plan.  Review of medical record does show previous diagnosis of BPPV treated with antivert and zofran. Also reviewed notes from neurologist.   Additionally, she does have a visit with her PCP coming up in 3 days.  Final Clinical Impressions(s) / UC Diagnoses   Final diagnoses:  Dizziness  Non-intractable vomiting with nausea, unspecified vomiting type  Benign paroxysmal positional vertigo of right ear  Parkinson disease ([place])     Discharge Instructions     You have decided to hold off on any work-up today including EKG and labs.  As we discussed, your symptoms do seem most consistent with positional type vertigo.  Please see handout.  I have sent a medication to help with dizziness.  Make sure you are changing positions slowly and carefully.  Have someone help you if you feel unstable.  I have also sent a medication for nausea and vomiting.  Make sure you are increasing your rest and fluid intake.  As we discussed, I cannot 100% rule out something more serious including stroke or intracranial abnormality.  If symptoms worsen or are not improved by tomorrow then you should call 911 or have  someone take you to the emergency department.  For any severe headaches, vision changes, lethargy or weakness, chest pain, palpitations or breathing difficulty or any severe acute changes, please call 911 or go to ED.    ED Prescriptions    Medication Sig Dispense Auth. Provider   meclizine (ANTIVERT) 25 MG tablet  (Status: Discontinued) Take 1 tablet (25 mg total) by mouth 3 (three) times daily as needed for up to 7 days for dizziness. 20 tablet Laurene Footman B, PA-C   ondansetron (ZOFRAN ODT) 8 MG disintegrating tablet  (Status: Discontinued) Take 1 tablet (8 mg total) by mouth every 8 (eight) hours as needed for up to 7 days for nausea or vomiting. 20 tablet Laurene Footman B, PA-C   ondansetron (ZOFRAN ODT) 8 MG disintegrating tablet Take 1 tablet (8 mg total) by mouth every 8 (eight) hours as needed for up to 7 days for nausea or vomiting. 20 tablet Danton Clap, PA-C   meclizine (ANTIVERT) 25 MG tablet  Take 1 tablet (25 mg total) by mouth 3 (three) times daily as needed for up to 7 days for dizziness. 20 tablet Gretta Cool     PDMP not reviewed this encounter.   Danton Clap, PA-C 02/21/21 [phone]

## 2021-02-20 NOTE — ED Triage Notes (Signed)
Pt presents with intermittent severe episodes of dizziness x 3 days.  Will hit suddenly and the room will spin.  Pt is unable to change position or walk.  Has caused her to vomit during episodes.  Pt denies CP, SOB, numbness, changes in speech or vision.    (Pt does have bobbing motion of head and squinting r/t Parkinsons and her meds)

## 2021-02-20 NOTE — Discharge Instructions (Signed)
You have decided to hold off on any work-up today including EKG and labs.  As we discussed, your symptoms do seem most consistent with positional type vertigo.  Please see handout.  I have sent a medication to help with dizziness.  Make sure you are changing positions slowly and carefully.  Have someone help you if you feel unstable.  I have also sent a medication for nausea and vomiting.  Make sure you are increasing your rest and fluid intake.  As we discussed, I cannot 100% rule out something more serious including stroke or intracranial abnormality.  If symptoms worsen or are not improved by tomorrow then you should call 911 or have someone take you to the emergency department.  For any severe headaches, vision changes, lethargy or weakness, chest pain, palpitations or breathing difficulty or any severe acute changes, please call 911 or go to ED.

## 2021-03-13 ENCOUNTER — Encounter: Payer: Self-pay | Admitting: Emergency Medicine

## 2021-03-13 ENCOUNTER — Ambulatory Visit
Admission: EM | Admit: 2021-03-13 | Discharge: 2021-03-13 | Disposition: A | Discharge status: Home / Self Care | Payer: Medicare Other | Attending: Emergency Medicine | Admitting: Emergency Medicine

## 2021-03-13 DIAGNOSIS — H109 Unspecified conjunctivitis: Secondary | ICD-10-CM

## 2021-03-13 DIAGNOSIS — R0982 Postnasal drip: Secondary | ICD-10-CM

## 2021-03-13 DIAGNOSIS — J309 Allergic rhinitis, unspecified: Secondary | ICD-10-CM

## 2021-03-13 MED ORDER — TOBRAMYCIN-DEXAMETHASONE 0.3-0.1 % OP SUSP: 2.0000 [drp] | Freq: Four times a day (QID) | OPHTHALMIC | 0 refills | Status: AC

## 2021-03-13 MED ORDER — IPRATROPIUM BROMIDE 0.06 % NA SOLN: 2.0000 | Freq: Four times a day (QID) | NASAL | 12 refills | Status: AC

## 2021-03-13 NOTE — ED Provider Notes (Signed)
[facility]    CSN: [id number] Arrival date & time: 03/13/21  0809      History   Chief Complaint Chief Complaint  Patient presents with  . Eye Drainage  . Eye Problem    HPI Jordan Duran is a 65 y.o. female.   HPI   65 year old female here for evaluation of bilateral eye redness and drainage.  Patient reports that she has had eye redness and yellow crusty drainage for the past week.  She denies itching in her eyes but she does claim that they burn and feel very dry.  She also has associated runny nose, nasal congestion, and has had a nonproductive cough for months.  She has taken 1 round of antibiotics and her PCP which did not help.  Patient denies any changes in vision or any foreign body sensation in her eye.  Past Medical History:  Diagnosis Date  . Arthritis   . Cancer ([place])    Melanoma of the Skin  . Depression   . Fracture of L1 vertebra ([place])   . Fracture of T12 vertebra ([place])   . Heart murmur   . Hyperlipidemia   . Parkinson disease ([place])   . Recurrent UTI     Patient Active Problem List   Diagnosis Date Noted  . DDD (degenerative disc disease), lumbar 03/10/2015  . DJD of shoulder 03/10/2015  . Greater trochanteric bursitis of both hips 03/10/2015  . 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 ABDOMINAL HYSTERECTOMY    . CERVICAL SPINE SURGERY    . CESAREAN SECTION    . CHOLECYSTECTOMY    . COLONOSCOPY WITH PROPOFOL N/A 07/29/2015   Procedure: COLONOSCOPY WITH PROPOFOL;  Surgeon: Hulen Luster, MD;  Location: [facility] ENDOSCOPY;  Service: Gastroenterology;  Laterality: N/A;  . TUBAL LIGATION      OB History   No obstetric history on file.      Home Medications    Prior to Admission medications   Medication Sig Start Date End Date Taking? Authorizing Provider  cefdinir (OMNICEF) 300 MG capsule Take 1 capsule (300 mg total) by mouth 2 (two) times daily. 04/04/20   Yes Cook, Jayce G, DO  citalopram (CELEXA) 20 MG tablet Take 20 mg by mouth daily.   Yes [provider]  gabapentin (NEURONTIN) 100 MG capsule Take 300 mg by mouth at bedtime. 01/24/20  Yes [provider]  HYDROcodone-acetaminophen (NORCO) 7.5-325 MG tablet Limit one half to one tab by mouth per day or twice per day if tolerated 06/14/16  Yes Mohammed Kindle, MD  ipratropium (ATROVENT) 0.06 % nasal spray Place 2 sprays into both nostrils 4 (four) times daily. 03/13/21  Yes Margarette Canada, NP  Multiple Vitamins-Minerals (MULTIVITAMIN ADULT PO) Take by mouth.   Yes [provider]  naloxone (NARCAN) 4 MG/0.1ML LIQD nasal spray kit Narcan 4 mg/actuation nasal spray   Yes [provider]  nortriptyline (PAMELOR) 25 MG capsule Take 25 mg by mouth at bedtime.   Yes [provider]  tiZANidine (ZANAFLEX) 2 MG tablet Take by mouth at bedtime.   Yes [provider]  tobramycin-dexamethasone (TOBRADEX) ophthalmic solution Place 2 drops into both eyes every 6 (six) hours. 03/13/21  Yes Margarette Canada, NP  YUVAFEM 10 MCG TABS vaginal tablet Place 1 tablet vaginally 2 (two) times a week. 03/14/20  Yes [provider]  Carbidopa-Levodopa ER (SINEMET CR) 25-100 MG tablet controlled release  Take 1.5 tablets by mouth.  09/05/18 02/20/21  [provider]  pramipexole (MIRAPEX) 0.5 MG tablet Take 0.5 mg by mouth 3 (three) times daily.  03/19/20  [provider]    Family History Family History  Problem Relation Age of Onset  . Arthritis Mother   . Depression Mother   . Diabetes Mother   . Early death Mother   . Varicose Veins Mother   . Heart disease Father   . Hypertension Father   . Arthritis Sister   . Depression Sister   . Drug abuse Sister   . Hypertension Maternal Grandmother   . Stroke Maternal Grandmother   . Varicose Veins Maternal Grandmother   . Diabetes Paternal Grandmother   . Arthritis Paternal Grandmother   .  Depression Son   . Diabetes Maternal Aunt   . Depression Maternal Aunt     Social History Social History   Tobacco Use  . Smoking status: Former Research scientist (life sciences)  . Smokeless tobacco: Former Systems developer    Quit date: 07/10/1974  Vaping Use  . Vaping Use: Never used  Substance Use Topics  . Alcohol use: No    Alcohol/week: 0.0 standard drinks  . Drug use: No     Allergies   Sulfa antibiotics and Demerol [meperidine]   Review of Systems Review of Systems  Constitutional: Negative for activity change, appetite change and fever.  HENT: Positive for congestion and rhinorrhea.   Eyes: Positive for pain, discharge and redness. Negative for photophobia and visual disturbance.  Respiratory: Positive for cough. Negative for shortness of breath and wheezing.   Skin: Negative for rash.  Hematological: Negative.   Psychiatric/Behavioral: Negative.      Physical Exam Triage Vital Signs ED Triage Vitals [03/13/21 0823]  Enc Vitals Group     BP      Pulse      Resp      Temp      Temp src      SpO2      Weight 154 lb 15.7 oz (70.3 kg)     Height 5' (1.524 m)     Head Circumference      Peak Flow      Pain Score 0     Pain Loc      Pain Edu?      Excl. in [place]?    No data found.  Updated Vital Signs BP 131/83   Pulse 98   Temp 98.3 F (36.8 C) (Oral)   Resp 18   Ht 5' (1.524 m)   Wt 154 lb 15.7 oz (70.3 kg)   SpO2 96%   BMI 30.27 kg/m   Visual Acuity Right Eye Distance:   Left Eye Distance:   Bilateral Distance:    Right Eye Near:   Left Eye Near:    Bilateral Near:     Physical Exam Vitals and nursing note reviewed.  Constitutional:      General: She is not in acute distress.    Appearance: Normal appearance. She is not ill-appearing.  HENT:     Head: Normocephalic and atraumatic.     Right Ear: Tympanic membrane, ear canal and external ear normal. There is no impacted cerumen.     Left Ear: Tympanic membrane, ear canal and external ear normal. There is no impacted  cerumen.     Nose: Congestion and rhinorrhea present.     Mouth/Throat:     Mouth: Mucous membranes are moist.     Pharynx: Oropharynx is  clear. Posterior oropharyngeal erythema present.  Cardiovascular:     Rate and Rhythm: Normal rate and regular rhythm.     Pulses: Normal pulses.     Heart sounds: Normal heart sounds. No murmur heard. No gallop.   Pulmonary:     Effort: Pulmonary effort is normal.     Breath sounds: Normal breath sounds. No wheezing, rhonchi or rales.  Skin:    General: Skin is warm and dry.     Capillary Refill: Capillary refill takes less than 2 seconds.     Findings: No erythema or rash.  Neurological:     General: No focal deficit present.     Mental Status: She is alert and oriented to person, place, and time.  Psychiatric:        Mood and Affect: Mood normal.        Behavior: Behavior normal.        Thought Content: Thought content normal.        Judgment: Judgment normal.      UC Treatments / Results  Labs (all labs ordered are listed, but only abnormal results are displayed) Labs Reviewed - No data to display  EKG   Radiology No results found.  Procedures Procedures (including critical care time)  Medications Ordered in UC Medications - No data to display  Initial Impression / Assessment and Plan / UC Course  I have reviewed the triage vital signs and the nursing notes.  Pertinent labs & imaging results that were available during my care of the patient were reviewed by me and considered in my medical decision making (see chart for details).   Patient is a very pleasant 65 year old female here for evaluation of bilateral eye redness and drainage that been going for the past week.  Denies itching but she does complain that her eyes are burning and dry in nature.  She has not been around anybody with similar symptoms but she does have multiple grandchildren.  She is unaware of rhythm having symptoms.  She denies any history of allergies but  she has had associated symptoms of runny nose, nasal congestion, and a nonproductive cough.  She denies any changes in vision.  Physical exam reveals bilateral bulbar and labral injection and erythema of conjunctiva.  There is crusty yellow discharge on both upper and lower lashes of both eyes.  Nasal mucosa is pale and mildly edematous with clear nasal discharge.  There is erythema and cobblestoning to the posterior oropharynx with clear postnasal drip.  Due to duration of patient's symptoms we will treat for bacterial conjunctivitis but suspect that her symptoms initially were allergic in nature.  Have also advised patient to start taking Claritin or Zyrtec nightly and will prescribe Atrovent nasal spray to help with allergy symptoms.   Final Clinical Impressions(s) / UC Diagnoses   Final diagnoses:  Conjunctivitis of both eyes, unspecified conjunctivitis type  Allergic rhinitis with postnasal drip     Discharge Instructions     Use the TobraDex eyedrops, 2 drops in each eye 4 times a day for 5 days.  Use the Atrovent nasal spray, 2 squirts up each nostril 4 times a day as needed for nasal congestion and runny nose.  Start taking a Claritin or Zyrtec over-the-counter antihistamine at nighttime to see if helps with your nonproductive cough and runny nose.  Return for reevaluation, or see your primary care provider, for any new or worsening symptoms.    ED Prescriptions    Medication Sig Dispense Auth. Provider  tobramycin-dexamethasone (TOBRADEX) ophthalmic solution Place 2 drops into both eyes every 6 (six) hours. 5 mL Margarette Canada, NP   ipratropium (ATROVENT) 0.06 % nasal spray Place 2 sprays into both nostrils 4 (four) times daily. 15 mL Margarette Canada, NP     PDMP not reviewed this encounter.   Margarette Canada, NP 03/13/21 [id number]

## 2021-03-13 NOTE — ED Triage Notes (Signed)
Patient c/o bilateral eye redness and drainage that started 1 week ago.

## 2021-03-13 NOTE — Discharge Instructions (Addendum)
Use the TobraDex eyedrops, 2 drops in each eye 4 times a day for 5 days.  Use the Atrovent nasal spray, 2 squirts up each nostril 4 times a day as needed for nasal congestion and runny nose.  Start taking a Claritin or Zyrtec over-the-counter antihistamine at nighttime to see if helps with your nonproductive cough and runny nose.  Return for reevaluation, or see your primary care provider, for any new or worsening symptoms.

## 2021-04-18 ENCOUNTER — Encounter: Payer: Self-pay | Admitting: Emergency Medicine

## 2021-04-18 ENCOUNTER — Ambulatory Visit (INDEPENDENT_AMBULATORY_CARE_PROVIDER_SITE_OTHER): Payer: Medicare Other

## 2021-04-18 ENCOUNTER — Ambulatory Visit
Admission: EM | Admit: 2021-04-18 | Discharge: 2021-04-18 | Disposition: A | Discharge status: Home / Self Care | Payer: Medicare Other | Attending: Emergency Medicine | Admitting: Emergency Medicine

## 2021-04-18 ENCOUNTER — Other Ambulatory Visit: Payer: Self-pay

## 2021-04-18 DIAGNOSIS — W19XXXA Unspecified fall, initial encounter: Secondary | ICD-10-CM

## 2021-04-18 DIAGNOSIS — S2232XA Fracture of one rib, left side, initial encounter for closed fracture: Secondary | ICD-10-CM

## 2021-04-18 DIAGNOSIS — R0789 Other chest pain: Secondary | ICD-10-CM

## 2021-04-18 MED ORDER — IBUPROFEN 600 MG PO TABS: 600.0000 mg | ORAL_TABLET | Freq: Four times a day (QID) | ORAL | 0 refills | Status: AC | PRN

## 2021-04-18 NOTE — ED Provider Notes (Signed)
HPI  SUBJECTIVE:  Jordan Duran is a 65 y.o. female who presents with left rib pain described as constant, dull, achy becoming sharp with cough and movement located primarily underneath her left breast.  She states it radiates to her back.  Symptoms started after having a trip and fall 3 days ago secondary to an episode of vertigo and her Parkinson's disease.  States that she fell onto her left chest with her arm underneath and onto a carpeted floor.  No bruising, coughing, wheezing, shortness of breath, fevers.  She denies hitting her head, loss of consciousness.  She states that her left arm and shoulder are without injury.  She tried  hydrocodone 1 tab twice daily which is prescribed by pain management.  She is allowed to take more if necessary.  She has also tried resting her left arm without improvement in her symptoms.  Symptoms are worse with taking a deep breath and, coughing, torso and arm movement.  She has a past medical history of chronic neck and back pain for which she is seen by pain management.  She has a history of vertigo, Parkinson's.  No history of osteoporosis, diabetes, chronic kidney disease, GI bleed, peptic ulcer disease.  PMD: Carmelina Noun primary care.    Past Medical History:  Diagnosis Date   Arthritis    Cancer ([place])    Melanoma of the Skin   Depression    Fracture of L1 vertebra ([facility])    Fracture of T12 vertebra ([facility])    Heart murmur    Hyperlipidemia    Parkinson disease ([place])    Recurrent UTI     Past Surgical History:  Procedure Laterality Date   ABDOMINAL HYSTERECTOMY     CERVICAL SPINE SURGERY     CESAREAN SECTION     CHOLECYSTECTOMY     COLONOSCOPY WITH PROPOFOL N/A 07/29/2015   Procedure: COLONOSCOPY WITH PROPOFOL;  Surgeon: Hulen Luster, MD;  Location: [facility] ENDOSCOPY;  Service: Gastroenterology;  Laterality: N/A;   TUBAL LIGATION      Family History  Problem Relation Age of Onset   Arthritis Mother    Depression Mother    Diabetes Mother     Early death Mother    Varicose Veins Mother    Heart disease Father    Hypertension Father    Arthritis Sister    Depression Sister    Drug abuse Sister    Hypertension Maternal Grandmother    Stroke Maternal Grandmother    Varicose Veins Maternal Grandmother    Diabetes Paternal Grandmother    Arthritis Paternal Grandmother    Depression Son    Diabetes Maternal Aunt    Depression Maternal Aunt     Social History   Tobacco Use   Smoking status: Former    Pack years: 0.00   Smokeless tobacco: Former    Quit date: 07/10/1974  Vaping Use   Vaping Use: Never used  Substance Use Topics   Alcohol use: No    Alcohol/week: 0.0 standard drinks   Drug use: No     Current Facility-Administered Medications:    HYDROcodone-acetaminophen (NORCO) 7.5-325 MG per tablet 1 tablet, 1 tablet, Oral, Q6H PRN, Mohammed Kindle, MD   HYDROcodone-acetaminophen (NORCO) 7.5-325 MG per tablet 1 tablet, 1 tablet, Oral, Q6H PRN, Mohammed Kindle, MD  Current Outpatient Medications:    Carbidopa-Levodopa ER (SINEMET CR) 25-100 MG tablet controlled release, Take 1.5 tablets by mouth. , Disp: , Rfl:    citalopram (CELEXA) 20 MG tablet,  Take 20 mg by mouth daily., Disp: , Rfl:    gabapentin (NEURONTIN) 100 MG capsule, Take 300 mg by mouth at bedtime., Disp: , Rfl:    HYDROcodone-acetaminophen (NORCO) 7.5-325 MG tablet, Limit one half to one tab by mouth per day or twice per day if tolerated, Disp: 40 tablet, Rfl: 0   ibuprofen (ADVIL) 600 MG tablet, Take 1 tablet (600 mg total) by mouth every 6 (six) hours as needed., Disp: 30 tablet, Rfl: 0   loratadine-pseudoephedrine (CLARITIN-D 24-HOUR) 10-240 MG 24 hr tablet, Take 1 tablet by mouth daily., Disp: , Rfl:    Multiple Vitamins-Minerals (MULTIVITAMIN ADULT PO), Take by mouth., Disp: , Rfl:    naloxone (NARCAN) 4 MG/0.1ML LIQD nasal spray kit, Narcan 4 mg/actuation nasal spray, Disp: , Rfl:    nortriptyline (PAMELOR) 25 MG capsule, Take 25 mg by mouth at  bedtime., Disp: , Rfl:    tiZANidine (ZANAFLEX) 2 MG tablet, Take by mouth at bedtime., Disp: , Rfl:    YUVAFEM 10 MCG TABS vaginal tablet, Place 1 tablet vaginally 2 (two) times a week., Disp: , Rfl:    ipratropium (ATROVENT) 0.06 % nasal spray, Place 2 sprays into both nostrils 4 (four) times daily., Disp: 15 mL, Rfl: 12   tobramycin-dexamethasone (TOBRADEX) ophthalmic solution, Place 2 drops into both eyes every 6 (six) hours., Disp: 5 mL, Rfl: 0  Allergies  Allergen Reactions   Sulfa Antibiotics Hives   Demerol [Meperidine] Nausea And Vomiting     ROS  As noted in HPI.   Physical Exam  BP 127/77 (BP Location: Right Arm)   Pulse 100   Temp 98.1 F (36.7 C) (Oral)   Resp 18   Ht 5' (1.524 m)   Wt 70.3 kg   SpO2 95%   BMI 30.27 kg/m   Constitutional: Well developed, well nourished, appears uncomfortable Eyes:  EOMI, conjunctiva normal bilaterally HENT: Normocephalic, atraumatic,mucus membranes moist Respiratory: Limited inspiratory effort.  Lungs clear bilaterally. Cardiovascular: Regular borderline tachycardia, no murmurs rubs or gallops GI: nondistended skin: No bruising over the left breast, skin intact.  Patient declined chaperone Musculoskeletal: No C-spine, T-spine tenderness.  No tenderness over the posterior ribs.  Positive tenderness over ribs  5 ,6 ,7 underneath the breast.  Breast nontender. Neurologic: Alert & oriented x 3, no focal neuro deficits Psychiatric: Speech and behavior appropriate   ED Course   Medications - No data to display  Orders Placed This Encounter  Procedures   DG Ribs Unilateral W/Chest Left    Standing Status:   Standing    Number of Occurrences:   1    Order Specific Question:   Reason for Exam (SYMPTOM  OR DIAGNOSIS REQUIRED)    Answer:   Fall, tenderness under the left breast.  Rule out rib fractures.   Turn cough incentive spirometry    Standing Status:   Standing    Number of Occurrences:   1    No results found for  this or any previous visit (from the past 24 hour(s)). DG Ribs Unilateral W/Chest Left  Result Date: 04/18/2021 CLINICAL DATA:  Fall with left breast tenderness. EXAM: LEFT RIBS AND CHEST - 3+ VIEW COMPARISON:  02/09/2016 FINDINGS: Frontal view of the chest and three views of left-sided ribs. Cervical spine fixation. Midline trachea. Normal heart size and mediastinal contours. No pleural effusion or pneumothorax. Clear lungs. Cholecystectomy clips. Radiographic marker at approximately the level of the ninth posterolateral left rib. There may be a minimally displaced posterolateral left  ninth rib fracture on the second dedicated rib film. IMPRESSION: Possible minimally displaced left ninth rib fracture, without pleural fluid or pneumothorax. Electronically Signed   By: Abigail Miyamoto M.D.   On: 04/18/2021 17:29    ED Clinical Impression  1. Closed fracture of one rib of left side, initial encounter   2. Fall, initial encounter      ED Assessment/Plan   Patient declined pain medication.  Concern for left-sided rib fractures.  will get rib series.  Reviewed imaging independently.  Possible minimally displaced left ninth rib fracture without pleural effusion, pneumothorax.  See radiology report for full details.  Suspect rib fracture.  X-ray not completely diagnostic, however, given recent history of trauma and tenderness in this area, will treat as a rib fracture.  Advised her to increase her Norco to the maximal amount allowed by her pain management clinic, states that she does not need a prescription for narcotics.  We will send her home with ibuprofen 600 mg for her to take with a Tylenol containing product 3-4 times a day as needed for pain.  Either ibuprofen/Tylenol for mild to moderate pain or ibuprofen/Norco for severe pain.  Giving patient incentive spirometer, and showed her how to use it.  Follow-up with PMD in several weeks, to the ER for fevers, worsening shortness of breath, or for any  concerns.  Discussed imaging, MDM, treatment plan, and plan for follow-up with patient. Discussed sn/sx that should prompt return to the ED. patient agrees with plan.   Meds ordered this encounter  Medications   ibuprofen (ADVIL) 600 MG tablet    Sig: Take 1 tablet (600 mg total) by mouth every 6 (six) hours as needed.    Dispense:  30 tablet    Refill:  0       *This clinic note was created using [employer]. Therefore, there may be occasional mistakes despite careful proofreading.  ?    Melynda Ripple, MD 04/20/21 [id number]

## 2021-04-18 NOTE — Discharge Instructions (Addendum)
Use the incentive spirometer as often as you can.  Ideally, 3-4 times per hour.  This will help prevent a pneumonia.  Take 600 mg of ibuprofen with a Tylenol containing product 3-4 times a day as needed for pain.  Either ibuprofen with 1000 mg of Tylenol for mild to moderate pain or ibuprofen with 1-2 Norco for severe pain.  Ask your pain management specialist how much Norco you are allowed in 1 day.  Go immediately to the ER for fevers above 100.4, worsening pain with breathing, shortness of breath, coughing up blood, or for any other concerns.

## 2021-04-18 NOTE — ED Triage Notes (Signed)
Pt c/o left sided rib/chest pain. She states she fell about 3 night ago. She has been dealing with vertigo along with Parkinson's and fell in the middle of the night going to bathroom. She states she feel with her left arm under her left breast. She states it is hard for her to take a deep breath or any kind of movement.

## 2021-06-15 ENCOUNTER — Ambulatory Visit
Admission: EM | Admit: 2021-06-15 | Discharge: 2021-06-15 | Disposition: A | Discharge status: Home / Self Care | Payer: Medicare Other | Attending: Emergency Medicine | Admitting: Emergency Medicine

## 2021-06-15 ENCOUNTER — Ambulatory Visit (INDEPENDENT_AMBULATORY_CARE_PROVIDER_SITE_OTHER): Payer: Medicare Other

## 2021-06-15 ENCOUNTER — Other Ambulatory Visit: Payer: Self-pay

## 2021-06-15 DIAGNOSIS — M545 Low back pain, unspecified: Secondary | ICD-10-CM | POA: Diagnosis not present

## 2021-06-15 DIAGNOSIS — W19XXXA Unspecified fall, initial encounter: Secondary | ICD-10-CM | POA: Diagnosis not present

## 2021-06-15 DIAGNOSIS — M549 Dorsalgia, unspecified: Secondary | ICD-10-CM

## 2021-06-15 MED ORDER — BACLOFEN 10 MG PO TABS: 10.0000 mg | ORAL_TABLET | Freq: Three times a day (TID) | ORAL | 0 refills | Status: AC

## 2021-06-15 NOTE — ED Triage Notes (Signed)
Pt here with C/O mid-back pain, fell on butt felt jarred, fell and broke pelvis in May, fell in June broke a rib, fell in July tore knee up.

## 2021-06-15 NOTE — Discharge Instructions (Addendum)
Use the pain medication already prescribed to you by pain management for control of your low back pain.  Take the baclofen, 10 mg every 8 hours, to help alleviate muscle spasm which may also be contributing to your back pain.  Do not take this with your tizanidine.  Apply moist heat to your low back for 20 minutes at a time 2-3 times a day.  If you have pain continues, or worsens, follow-up with your pain specialist for additional treatment options.

## 2021-06-15 NOTE — ED Provider Notes (Signed)
[facility]    CSN: [id number] Arrival date & time: 06/15/21  1246      History   Chief Complaint No chief complaint on file.   HPI Jordan Duran is a 65 y.o. female.   HPI  65 year old female here for evaluation of low back pain.  Patient reports that she was attempting to get out of bed yesterday morning and before she was able to stand up her feet slid out from underneath her and she fell landing on her butt on the floor and jarring her back.  Patient states that she has some fractured vertebrae in her back and this is where she is having her pain.  She does have a weak feeling in both legs but she denies any numbness or tingling.  Patient denies any loss of bowel or bladder control or numbness of her inner thighs or perineum.  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      Home Medications    Prior to Admission medications   Medication Sig Start Date End Date Taking? Authorizing Provider  baclofen (LIORESAL) 10 MG tablet Take 1 tablet (10 mg total) by mouth 3 (three) times daily. 06/15/21  Yes  Margarette Canada, NP  citalopram (CELEXA) 20 MG tablet Take 20 mg by mouth daily.   Yes [provider]  gabapentin (NEURONTIN) 100 MG capsule Take 300 mg by mouth at bedtime. 01/24/20  Yes [provider]  HYDROcodone-acetaminophen (NORCO) 7.5-325 MG tablet Limit one half to one tab by mouth per day or twice per day if tolerated 06/14/16  Yes Mohammed Kindle, MD  ibuprofen (ADVIL) 600 MG tablet Take 1 tablet (600 mg total) by mouth every 6 (six) hours as needed. 04/18/21  Yes Melynda Ripple, MD  ipratropium (ATROVENT) 0.06 % nasal spray Place 2 sprays into both nostrils 4 (four) times daily. 03/13/21  Yes Margarette Canada, NP  loratadine-pseudoephedrine (CLARITIN-D 24-HOUR) 10-240 MG 24 hr tablet Take 1 tablet by mouth daily.   Yes [provider]  Multiple Vitamins-Minerals (MULTIVITAMIN ADULT PO) Take by mouth.   Yes [provider]  naloxone (NARCAN) 4 MG/0.1ML LIQD nasal spray kit Narcan 4 mg/actuation nasal spray   Yes [provider]  nortriptyline (PAMELOR) 25 MG capsule Take 25 mg by mouth at bedtime.   Yes [provider]  tiZANidine (ZANAFLEX) 2 MG tablet Take by mouth at bedtime.   Yes [provider]  tobramycin-dexamethasone [facility])  ophthalmic solution Place 2 drops into both eyes every 6 (six) hours. 03/13/21  Yes Margarette Canada, NP  YUVAFEM 10 MCG TABS vaginal tablet Place 1 tablet vaginally 2 (two) times a week. 03/14/20  Yes [provider]  Carbidopa-Levodopa ER (SINEMET CR) 25-100 MG tablet controlled release Take 1.5 tablets by mouth.  09/05/18 04/18/21  [provider]  pramipexole (MIRAPEX) 0.5 MG tablet Take 0.5 mg by mouth 3 (three) times daily.  03/19/20  [provider]    Family History Family History  Problem Relation Age of Onset   Arthritis Mother    Depression Mother    Diabetes Mother    Early death Mother    Varicose Veins Mother    Heart disease Father    Hypertension Father     Arthritis Sister    Depression Sister    Drug abuse Sister    Hypertension Maternal Grandmother    Stroke Maternal Grandmother    Varicose Veins Maternal Grandmother    Diabetes Paternal Grandmother    Arthritis Paternal Grandmother    Depression Son    Diabetes Maternal Aunt    Depression Maternal Aunt     Social History Social History   Tobacco Use   Smoking status: Former   Smokeless tobacco: Former    Quit date: 07/10/1974  Vaping Use   Vaping Use: Never used  Substance Use Topics   Alcohol use: No    Alcohol/week: 0.0 standard drinks   Drug use: No     Allergies   Sulfa antibiotics and Demerol [meperidine]   Review of Systems Review of Systems  Constitutional:  Negative for activity change, appetite change and fever.  Musculoskeletal:  Positive for back pain.  Neurological:  Negative for weakness and numbness.  Hematological: Negative.   Psychiatric/Behavioral: Negative.      Physical Exam Triage Vital Signs ED Triage Vitals  Enc Vitals Group     BP 06/15/21 1314 112/77     Pulse Rate 06/15/21 1314 (!) 108     Resp 06/15/21 1314 18     Temp 06/15/21 1314 98.8 F (37.1 C)     Temp Source 06/15/21 1314 Oral     SpO2 06/15/21 1314 96 %     Weight 06/15/21 1312 149 lb (67.6 kg)     Height 06/15/21 1312 5' (1.524 m)     Head Circumference --      Peak Flow --      Pain Score 06/15/21 1311 9     Pain Loc --      Pain Edu? --      Excl. in [place]? --    No data found.  Updated Vital Signs BP 112/77 (BP Location: Left Arm)   Pulse (!) 108   Temp 98.8 F (37.1 C) (Oral)   Resp 18   Ht 5' (1.524 m)   Wt 149 lb (67.6 kg)   SpO2 96%   BMI 29.10 kg/m   Visual Acuity Right Eye Distance:   Left Eye Distance:   Bilateral Distance:    Right Eye Near:   Left Eye Near:    Bilateral Near:     Physical Exam Vitals and nursing note reviewed.  Constitutional:      General: She is not in acute distress.    Appearance: Normal appearance. She is obese.  She is not ill-appearing.  HENT:     Head: Normocephalic and atraumatic.  Cardiovascular:     Rate and Rhythm: Normal rate and regular  rhythm.     Pulses: Normal pulses.     Heart sounds: Normal heart sounds. No murmur heard.   No gallop.  Pulmonary:     Effort: Pulmonary effort is normal.     Breath sounds: Normal breath sounds. No wheezing, rhonchi or rales.  Musculoskeletal:        General: Tenderness present. No swelling or deformity.  Skin:    General: Skin is warm and dry.     Capillary Refill: Capillary refill takes less than 2 seconds.     Findings: No erythema or rash.  Neurological:     General: No focal deficit present.     Mental Status: She is alert and oriented to person, place, and time.  Psychiatric:        Mood and Affect: Mood normal.        Behavior: Behavior normal.        Thought Content: Thought content normal.        Judgment: Judgment normal.     UC Treatments / Results  Labs (all labs ordered are listed, but only abnormal results are displayed) Labs Reviewed - No data to display  EKG   Radiology DG Lumbar Spine Complete  Result Date: 06/15/2021 CLINICAL DATA:  Back pain post fall EXAM: LUMBAR SPINE - COMPLETE 4+ VIEW COMPARISON:  MR 01/20/2004 by report only FINDINGS: L1 compression fracture deformity with less than 50% loss of height anteriorly, which was described on the previous study. T12 compression fracture deformity with greater than 50% loss of height anteriorly, which in retrospect may have been present on previous rib radiographs of 04/18/2021. No definite acute fracture. No spondylolisthesis. Cholecystectomy clips. Surgical clips in the anterior pelvis. IMPRESSION: 1. No definite acute findings. 2. T12 and L1 compression fracture deformities, possibly chronic. Electronically Signed   By: Lucrezia Europe M.D.   On: 06/15/2021 14:10    Procedures Procedures (including critical care time)  Medications Ordered in UC Medications - No data to  display  Initial Impression / Assessment and Plan / UC Course  I have reviewed the triage vital signs and the nursing notes.  Pertinent labs & imaging results that were available during my care of the patient were reviewed by me and considered in my medical decision making (see chart for details).  Patient is a nontoxic-appearing 65 year old female here for evaluation of midline upper lumbar back pain that started after she is slid out of bed and laid on her butt yesterday morning.  She denies any numbness or tingling in her legs and she denies any loss of bowel or bladder control.  She has no numbness of her perineum or inner thighs.  Patient's physical exam reveals midline spinal tenderness over L1 and L2.  There is no spinal tenderness above or below this level.  Patient's bilateral lower extremity strength is 5/5 with 2+ DTRs bilaterally.  Patient has significant orthopedic history and has a history of compression fractures of her first and second lumbar vertebrae.  She reports that is not currently being managed by anyone due to the age.  We will obtain radiographs of lumbar spine.  Patient also has moderate spasm of the left and right lumbar paraspinous muscle groups.  Lumbar spine films independently reviewed and evaluated by me.  Trepidation: There are old compression fractures of T12 and L1.  There is a slight loss to the lordotic curve of the lumbar spine.  No other abnormalities noted.  Awaiting radiology overread. Radiology interpretation is that there are  T12 and L1 compression fracture deformities that are possibly chronic in nature.  No acute findings.  Patient is currently followed by pain management who prescribes Norco 7.5 mg 1/2 to 1 tablet twice daily.  Patient states that she is currently taking this medication.  The patient being on a pain contract I have advised her that I will not write her for any additional narcotic pain medication.  I will prescribe baclofen 10 mg 3 times daily  to help with muscle spasm and have patient follow-up with pain management for continued or worsening pain.   Final Clinical Impressions(s) / UC Diagnoses   Final diagnoses:  Midline low back pain without sciatica, unspecified chronicity     Discharge Instructions      Use the pain medication already prescribed to you by pain management for control of your low back pain.  Take the baclofen, 10 mg every 8 hours, to help alleviate muscle spasm which may also be contributing to your back pain.  Do not take this with your tizanidine.  Apply moist heat to your low back for 20 minutes at a time 2-3 times a day.  If you have pain continues, or worsens, follow-up with your pain specialist for additional treatment options.     ED Prescriptions     Medication Sig Dispense Auth. Provider   baclofen (LIORESAL) 10 MG tablet Take 1 tablet (10 mg total) by mouth 3 (three) times daily. 28 each Margarette Canada, NP      PDMP not reviewed this encounter.   Margarette Canada, NP 06/15/21 1420

## 2022-04-13 ENCOUNTER — Encounter: Payer: Self-pay | Admitting: Gastroenterology

## 2022-04-16 ENCOUNTER — Encounter: Payer: Self-pay | Admitting: Gastroenterology

## 2022-04-16 ENCOUNTER — Encounter: Admission: RE | Payer: Self-pay | Source: Home / Self Care

## 2022-04-16 ENCOUNTER — Ambulatory Visit: Admission: RE | Admit: 2022-04-16 | Payer: Medicare Other | Source: Home / Self Care | Admitting: Gastroenterology

## 2022-04-16 HISTORY — DX: Inflammatory liver disease, unspecified: K75.9

## 2022-04-16 HISTORY — DX: Tremor, unspecified: R25.1

## 2022-04-16 HISTORY — DX: Other specified postprocedural states: Z98.890

## 2022-04-16 SURGERY — COLONOSCOPY
Anesthesia: General

## 2022-07-09 ENCOUNTER — Encounter
Admission: RE | Disposition: A | Discharge status: Home / Self Care | Payer: Self-pay | Source: Home / Self Care | Attending: Gastroenterology

## 2022-07-09 ENCOUNTER — Ambulatory Visit
Admission: RE | Admit: 2022-07-09 | Discharge: 2022-07-09 | Disposition: A | Discharge status: Home / Self Care | Payer: Medicare Other | Attending: Gastroenterology | Admitting: Gastroenterology

## 2022-07-09 ENCOUNTER — Ambulatory Visit: Payer: Medicare Other | Admitting: Anesthesiology

## 2022-07-09 ENCOUNTER — Encounter: Payer: Self-pay | Admitting: Gastroenterology

## 2022-07-09 DIAGNOSIS — D120 Benign neoplasm of cecum: Secondary | ICD-10-CM | POA: Diagnosis not present

## 2022-07-09 DIAGNOSIS — D123 Benign neoplasm of transverse colon: Secondary | ICD-10-CM | POA: Diagnosis not present

## 2022-07-09 DIAGNOSIS — Z86010 Personal history of colonic polyps: Secondary | ICD-10-CM | POA: Diagnosis not present

## 2022-07-09 DIAGNOSIS — K640 First degree hemorrhoids: Secondary | ICD-10-CM | POA: Insufficient documentation

## 2022-07-09 DIAGNOSIS — M1990 Unspecified osteoarthritis, unspecified site: Secondary | ICD-10-CM | POA: Insufficient documentation

## 2022-07-09 DIAGNOSIS — Z87891 Personal history of nicotine dependence: Secondary | ICD-10-CM | POA: Insufficient documentation

## 2022-07-09 DIAGNOSIS — D124 Benign neoplasm of descending colon: Secondary | ICD-10-CM | POA: Insufficient documentation

## 2022-07-09 DIAGNOSIS — G20 Parkinson's disease: Secondary | ICD-10-CM | POA: Diagnosis not present

## 2022-07-09 DIAGNOSIS — D125 Benign neoplasm of sigmoid colon: Secondary | ICD-10-CM | POA: Insufficient documentation

## 2022-07-09 DIAGNOSIS — Z1211 Encounter for screening for malignant neoplasm of colon: Secondary | ICD-10-CM | POA: Insufficient documentation

## 2022-07-09 HISTORY — PX: COLONOSCOPY: SHX5424

## 2022-07-09 SURGERY — COLONOSCOPY
Anesthesia: General

## 2022-07-09 MED ORDER — PROPOFOL 1000 MG/100ML IV EMUL
INTRAVENOUS | Status: AC
  Filled 2022-07-09: qty 800

## 2022-07-09 MED ORDER — PROPOFOL 10 MG/ML IV BOLUS
INTRAVENOUS | Status: DC | PRN
  Administered 2022-07-09: 60 mg via INTRAVENOUS
  Administered 2022-07-09: 5 mg via INTRAVENOUS
  Administered 2022-07-09: 10 mg via INTRAVENOUS

## 2022-07-09 MED ORDER — LIDOCAINE HCL (CARDIAC) PF 100 MG/5ML IV SOSY
PREFILLED_SYRINGE | INTRAVENOUS | Status: DC | PRN
  Administered 2022-07-09: 100 mg via INTRAVENOUS

## 2022-07-09 MED ORDER — PHENYLEPHRINE 80 MCG/ML (10ML) SYRINGE FOR IV PUSH (FOR BLOOD PRESSURE SUPPORT)
PREFILLED_SYRINGE | INTRAVENOUS | Status: DC | PRN
  Administered 2022-07-09 (×2): 80 ug via INTRAVENOUS

## 2022-07-09 MED ORDER — EPHEDRINE SULFATE (PRESSORS) 50 MG/ML IJ SOLN
INTRAMUSCULAR | Status: DC | PRN
  Administered 2022-07-09: 5 mg via INTRAVENOUS
  Administered 2022-07-09: 10 mg via INTRAVENOUS

## 2022-07-09 MED ORDER — SODIUM CHLORIDE 0.9 % IV SOLN: INTRAVENOUS | Status: DC

## 2022-07-09 MED ORDER — PROPOFOL 500 MG/50ML IV EMUL
INTRAVENOUS | Status: DC | PRN
  Administered 2022-07-09: 155 ug/kg/min via INTRAVENOUS

## 2022-07-09 NOTE — Op Note (Signed)
[facility] Gastroenterology Patient Name: Jordan Duran Procedure Date: 07/09/2022 7:34 AM MRN: [id number] Account #: [id number] Date of Birth: 08/15/1956 Admit Type: Outpatient Age: 66 Room: [facility] ENDO ROOM 2 Gender: Female Note Status: Finalized Instrument Name: Peds Colonoscope [id number] Procedure:             Colonoscopy Indications:           High risk colon cancer surveillance: Personal history                         of colonic polyps Providers:             Annamaria Helling DO, DO Medicines:             Monitored Anesthesia Care Complications:         No immediate complications. Estimated blood loss:                         Minimal. Procedure:             Pre-Anesthesia Assessment:                        - Prior to the procedure, a History and Physical was                         performed, and patient medications and allergies were                         reviewed. The patient is competent. The risks and                         benefits of the procedure and the sedation options and                         risks were discussed with the patient. All questions                         were answered and informed consent was obtained.                         Patient identification and proposed procedure were                         verified by the physician, the nurse, the anesthetist                         and the technician in the endoscopy suite. Mental                         Status Examination: alert and oriented. Airway                         Examination: normal oropharyngeal airway and neck                         mobility. Respiratory Examination: clear to                         auscultation. CV Examination: RRR, no murmurs, no S3  or S4. Prophylactic Antibiotics: The patient does not                         require prophylactic antibiotics. Prior                         Anticoagulants: The patient has taken no previous                          anticoagulant or antiplatelet agents. ASA Grade                         Assessment: II - A patient with mild systemic disease.                         After reviewing the risks and benefits, the patient                         was deemed in satisfactory condition to undergo the                         procedure. The anesthesia plan was to use monitored                         anesthesia care (MAC). Immediately prior to                         administration of medications, the patient was                         re-assessed for adequacy to receive sedatives. The                         heart rate, respiratory rate, oxygen saturations,                         blood pressure, adequacy of pulmonary ventilation, and                         response to care were monitored throughout the                         procedure. The physical status of the patient was                         re-assessed after the procedure.                        After obtaining informed consent, the colonoscope was                         passed under direct vision. Throughout the procedure,                         the patient's blood pressure, pulse, and oxygen                         saturations were monitored continuously. The  Colonoscope was introduced through the anus and                         advanced to the the cecum, identified by appendiceal                         orifice and ileocecal valve. The colonoscopy was                         performed without difficulty. The patient tolerated                         the procedure well. The quality of the bowel                         preparation was evaluated using the BBPS [facility] Bowel                         Preparation Scale) with scores of: Right Colon = 3,                         Transverse Colon = 3 and Left Colon = 3 (entire mucosa                         seen well with no residual staining, small fragments                          of stool or opaque liquid). The total BBPS score                         equals 9. The ileocecal valve, appendiceal orifice,                         and rectum were photographed. Findings:      The perianal and digital rectal examinations were normal. Pertinent       negatives include normal sphincter tone.      Two sessile polyps were found in the descending colon and cecum. The       polyps were 3 to 7 mm in size. These polyps were removed with a cold       snare. Resection and retrieval were complete. Estimated blood loss was       minimal.      Three sessile polyps were found in the sigmoid colon (2) and transverse       colon (1). The polyps were 1 to 2 mm in size. These polyps were removed       with a jumbo cold forceps. Resection and retrieval were complete.       Estimated blood loss was minimal.      Non-bleeding internal hemorrhoids were found during retroflexion. The       hemorrhoids were Grade I (internal hemorrhoids that do not prolapse).       Estimated blood loss: none.      The exam was otherwise without abnormality on direct and retroflexion       views. Impression:            - Two 3 to 7 mm polyps in the descending colon and in  the cecum, removed with a cold snare. Resected and                         retrieved.                        - Three 1 to 2 mm polyps in the sigmoid colon and in                         the transverse colon, removed with a jumbo cold                         forceps. Resected and retrieved.                        - Non-bleeding internal hemorrhoids.                        - The examination was otherwise normal on direct and                         retroflexion views. Recommendation:        - Patient has a contact number available for                         emergencies. The signs and symptoms of potential                         delayed complications were discussed with the patient.                          Return to normal activities tomorrow. Written                         discharge instructions were provided to the patient.                        - Discharge patient to home.                        - Resume previous diet.                        - Continue present medications.                        - No aspirin, ibuprofen, naproxen, or other                         non-steroidal anti-inflammatory drugs for 5 days after                         polyp removal.                        - Await pathology results.                        - Repeat colonoscopy for surveillance based on  pathology results.                        - Return to referring physician as previously                         scheduled.                        - The findings and recommendations were discussed with                         the patient. Procedure Code(s):     --- Professional ---                        [phone], Colonoscopy, flexible; with removal of                         tumor(s), polyp(s), or other lesion(s) by snare                         technique                        45380, 41, Colonoscopy, flexible; with biopsy, single                         or multiple Diagnosis Code(s):     --- Professional ---                        Z86.010, Personal history of colonic polyps                        K63.5, Polyp of colon                        K64.0, First degree hemorrhoids CPT copyright 2019 American Medical Association. All rights reserved. The codes documented in this report are preliminary and upon coder review may  be revised to meet current compliance requirements. Attending Participation:      I personally performed the entire procedure. Volney American, DO Annamaria Helling DO, DO 07/09/2022 8:21:53 AM This report has been signed electronically. Number of Addenda: 0 Note Initiated On: 07/09/2022 7:34 AM Scope Withdrawal Time: 0 hours 22 minutes 25 seconds  Total Procedure Duration:  0 hours 31 minutes 55 seconds  Estimated Blood Loss:  Estimated blood loss was minimal.      [facility]

## 2022-07-09 NOTE — Interval H&P Note (Signed)
History and Physical Interval Note: Preprocedure H&P from 07/09/22  was reviewed and there was no interval change after seeing and examining the patient.  Written consent was obtained from the patient after discussion of risks, benefits, and alternatives. Patient has consented to proceed with Colonoscopy with possible intervention   07/09/2022 7:34 AM  Jordan Duran  has presented today for surgery, with the diagnosis of Hx of adenomatous colonic polyps (Z86.010).  The various methods of treatment have been discussed with the patient and family. After consideration of risks, benefits and other options for treatment, the patient has consented to  Procedure(s): COLONOSCOPY (N/A) as a surgical intervention.  The patient's history has been reviewed, patient examined, no change in status, stable for surgery.  I have reviewed the patient's chart and labs.  Questions were answered to the patient's satisfaction.     Annamaria Helling

## 2022-07-09 NOTE — H&P (Signed)
Pre-Procedure H&P   Patient ID: Jordan Duran is a 66 y.o. female.  Gastroenterology Provider: Annamaria Helling, DO  Referring Provider: Octavia Bruckner, PA PCP: Clarisse Gouge, MD  Date: 07/09/2022  HPI Ms. Jordan Duran is a 66 y.o. female who presents today for Colonoscopy for surveillance- personal history colon polyps. Last colonoscopy in September 2016 that was normal.  She did have 2 tubular adenomatous polyps and 2011.  No family history of colon cancer or colon polyps.  She regularly deals with constipation requiring laxatives and enemas.  She denies any melena or hematochezia.  Last reported hemoglobin 12.7 MCV 89 platelets 218,000 creatinine 0.67  Past Medical History:  Diagnosis Date   Arthritis    Cancer ([place])    Melanoma of the Skin   Depression    Fracture of L1 vertebra (HCC)    Fracture of T12 vertebra (HCC)    Heart murmur    Hepatitis    Hyperlipidemia    Melanoma of vulva (HCC) 1995   Parkinson disease ([place])    PONV (postoperative nausea and vomiting)    Recurrent UTI    Tremor     Past Surgical History:  Procedure Laterality Date   ABDOMINAL HYSTERECTOMY     CERVICAL SPINE SURGERY     CESAREAN SECTION     CHOLECYSTECTOMY     COLONOSCOPY WITH PROPOFOL N/A 07/29/2015   Procedure: COLONOSCOPY WITH PROPOFOL;  Surgeon: Hulen Luster, MD;  Location: [facility] ENDOSCOPY;  Service: Gastroenterology;  Laterality: N/A;   TUBAL LIGATION      Family History No h/o GI disease or malignancy  Review of Systems  Constitutional:  Negative for activity change, appetite change, chills, diaphoresis, fatigue, fever and unexpected weight change.  HENT:  Negative for trouble swallowing and voice change.   Respiratory:  Negative for shortness of breath and wheezing.   Cardiovascular:  Negative for chest pain, palpitations and leg swelling.  Gastrointestinal:  Negative for abdominal distention, abdominal pain, anal bleeding, blood in stool, constipation, diarrhea,  nausea, rectal pain and vomiting.  Musculoskeletal:  Negative for arthralgias and myalgias.  Skin:  Negative for color change and pallor.  Neurological:  Negative for dizziness, syncope and weakness.  Psychiatric/Behavioral:  Negative for confusion.   All other systems reviewed and are negative.    Medications No current facility-administered medications on file prior to encounter.   Current Outpatient Medications on File Prior to Encounter  Medication Sig Dispense Refill   amantadine (SYMMETREL) 100 MG capsule Take 100 mg by mouth 2 (two) times daily.     baclofen (LIORESAL) 10 MG tablet Take 1 tablet (10 mg total) by mouth 3 (three) times daily. 30 each 0   calcium carbonate (TUMS - DOSED IN MG ELEMENTAL CALCIUM) 500 MG chewable tablet Chew 1 tablet by mouth 2 (two) times daily.     Cholecalciferol (VITAMIN D-3 PO) Take 1,000 capsules by mouth daily.     citalopram (CELEXA) 20 MG tablet Take 20 mg by mouth daily.     gabapentin (NEURONTIN) 100 MG capsule Take 300 mg by mouth at bedtime.     ipratropium (ATROVENT) 0.06 % nasal spray Place 2 sprays into both nostrils 4 (four) times daily. 15 mL 12   loratadine-pseudoephedrine (CLARITIN-D 24-HOUR) 10-240 MG 24 hr tablet Take 1 tablet by mouth daily.     mometasone-formoterol (DULERA) 100-5 MCG/ACT AERO Inhale 2 puffs into the lungs 2 (two) times daily.     Multiple Vitamins-Minerals (MULTIVITAMIN ADULT PO)  Take by mouth.     naloxone (NARCAN) 4 MG/0.1ML LIQD nasal spray kit Narcan 4 mg/actuation nasal spray     nortriptyline (PAMELOR) 25 MG capsule Take 25 mg by mouth at bedtime.     tiZANidine (ZANAFLEX) 2 MG tablet Take by mouth at bedtime.     tobramycin-dexamethasone (TOBRADEX) ophthalmic solution Place 2 drops into both eyes every 6 (six) hours. 5 mL 0   YUVAFEM 10 MCG TABS vaginal tablet Place 1 tablet vaginally 2 (two) times a week.     Carbidopa-Levodopa ER (SINEMET CR) 25-100 MG tablet controlled release Take 1.5 tablets by mouth.       HYDROcodone-acetaminophen (NORCO) 7.5-325 MG tablet Limit one half to one tab by mouth per day or twice per day if tolerated 40 tablet 0   ibuprofen (ADVIL) 600 MG tablet Take 1 tablet (600 mg total) by mouth every 6 (six) hours as needed. 30 tablet 0   [DISCONTINUED] pramipexole (MIRAPEX) 0.5 MG tablet Take 0.5 mg by mouth 3 (three) times daily.      Pertinent medications related to GI and procedure were reviewed by me with the patient prior to the procedure   Current Facility-Administered Medications:    0.9 %  sodium chloride infusion, , Intravenous, Continuous, Annamaria Helling, DO  sodium chloride         Allergies  Allergen Reactions   Sulfa Antibiotics Hives   Demerol [Meperidine] Nausea And Vomiting   Allergies were reviewed by me prior to the procedure  Objective   Body mass index is 28.9 kg/m. Vitals:   07/09/22 0704  BP: (!) 156/91  Pulse: 86  Resp: 20  Temp: (!) 96.7 F (35.9 C)  SpO2: 98%  Weight: 67.1 kg  Height: 5' (1.524 m)     Physical Exam Vitals and nursing note reviewed.  Constitutional:      General: She is not in acute distress.    Appearance: Normal appearance. She is not ill-appearing, toxic-appearing or diaphoretic.  HENT:     Head: Normocephalic and atraumatic.     Nose: Nose normal.     Mouth/Throat:     Mouth: Mucous membranes are moist.     Pharynx: Oropharynx is clear.  Eyes:     General: No scleral icterus.    Extraocular Movements: Extraocular movements intact.  Cardiovascular:     Rate and Rhythm: Normal rate and regular rhythm.     Heart sounds: Normal heart sounds. No murmur heard.    No friction rub. No gallop.  Pulmonary:     Effort: Pulmonary effort is normal. No respiratory distress.     Breath sounds: Normal breath sounds. No wheezing, rhonchi or rales.  Abdominal:     General: Bowel sounds are normal. There is no distension.     Palpations: Abdomen is soft.     Tenderness: There is no abdominal  tenderness. There is no guarding or rebound.  Musculoskeletal:     Cervical back: Neck supple.     Right lower leg: No edema.     Left lower leg: No edema.  Skin:    General: Skin is warm and dry.     Coloration: Skin is not jaundiced or pale.  Neurological:     Mental Status: She is alert and oriented to person, place, and time. Mental status is at baseline.     Comments: Hand tremor  Psychiatric:        Mood and Affect: Mood normal.  Behavior: Behavior normal.        Thought Content: Thought content normal.        Judgment: Judgment normal.      Assessment:  Ms. Jordan Duran is a 66 y.o. female  who presents today for Colonoscopy for surveillance- personal history colon polyps.  Plan:  Colonoscopy with possible intervention today  Colonoscopy with possible biopsy, control of bleeding, polypectomy, and interventions as necessary has been discussed with the patient/patient representative. Informed consent was obtained from the patient/patient representative after explaining the indication, nature, and risks of the procedure including but not limited to death, bleeding, perforation, missed neoplasm/lesions, cardiorespiratory compromise, and reaction to medications. Opportunity for questions was given and appropriate answers were provided. Patient/patient representative has verbalized understanding is amenable to undergoing the procedure.   Annamaria Helling, DO  [facility]  Portions of the record may have been created with voice recognition software. Occasional wrong-word or 'sound-a-like' substitutions may have occurred due to the inherent limitations of voice recognition software.  Read the chart carefully and recognize, using context, where substitutions may have occurred.

## 2022-07-09 NOTE — Anesthesia Procedure Notes (Signed)
Procedure Name: General with mask airway Date/Time: 07/09/2022 8:10 AM  Performed by: Kelton Pillar, CRNAPre-anesthesia Checklist: Patient identified, Emergency Drugs available, Suction available and Patient being monitored Patient Re-evaluated:Patient Re-evaluated prior to induction Oxygen Delivery Method: Simple face mask Induction Type: IV induction Placement Confirmation: positive ETCO2, CO2 detector and breath sounds checked- equal and bilateral Dental Injury: Teeth and Oropharynx as per pre-operative assessment

## 2022-07-09 NOTE — Anesthesia Postprocedure Evaluation (Signed)
Anesthesia Post Note  Patient: Jordan Duran  Procedure(s) Performed: COLONOSCOPY  Patient location during evaluation: Endoscopy Anesthesia Type: General Level of consciousness: awake and alert Pain management: pain level controlled Vital Signs Assessment: post-procedure vital signs reviewed and stable Respiratory status: spontaneous breathing, nonlabored ventilation and respiratory function stable Cardiovascular status: blood pressure returned to baseline and stable Postop Assessment: no apparent nausea or vomiting Anesthetic complications: no   No notable events documented.   Last Vitals:  Vitals:   07/09/22 0818 07/09/22 0828  BP: (!) 95/52 128/71  Pulse:    Resp:    Temp: (!) 35.6 C   SpO2:      Last Pain:  Vitals:   07/09/22 0848  TempSrc:   PainSc: 0-No pain                 [place] Ouch

## 2022-07-09 NOTE — Anesthesia Preprocedure Evaluation (Addendum)
Anesthesia Evaluation  Patient identified by MRN, date of birth, ID band Patient awake    Reviewed: Allergy & Precautions, NPO status , Patient's Chart, lab work & pertinent test results  Airway Mallampati: III  TM Distance: >3 FB Neck ROM: Limited    Dental no notable dental hx.    Pulmonary neg pulmonary ROS, former smoker,    Pulmonary exam normal        Cardiovascular negative cardio ROS Normal cardiovascular exam     Neuro/Psych Parkinson disease  negative psych ROS   GI/Hepatic negative GI ROS, Neg liver ROS,   Endo/Other  negative endocrine ROS  Renal/GU negative Renal ROS  negative genitourinary   Musculoskeletal  (+) Arthritis ,   Abdominal Normal abdominal exam  (+)   Peds  Hematology negative hematology ROS (+)   Anesthesia Other Findings Past Medical History: No date: Arthritis No date: Cancer ([place])     Comment:  Melanoma of the Skin No date: Depression No date: Fracture of L1 vertebra (HCC) No date: Fracture of T12 vertebra (HCC) No date: Heart murmur No date: Hepatitis No date: Hyperlipidemia 1995: Melanoma of vulva ([place]) No date: Parkinson disease ([place]) No date: PONV (postoperative nausea and vomiting) No date: Recurrent UTI No date: Tremor  Past Surgical History: No date: ABDOMINAL HYSTERECTOMY No date: CERVICAL SPINE SURGERY No date: CESAREAN SECTION No date: CHOLECYSTECTOMY 07/29/2015: COLONOSCOPY WITH PROPOFOL; N/A     Comment:  Procedure: COLONOSCOPY WITH PROPOFOL;  Surgeon: Hulen Luster, MD;  Location: [facility] ENDOSCOPY;  Service:               Gastroenterology;  Laterality: N/A; No date: TUBAL LIGATION  BMI    Body Mass Index: 28.90 kg/m      Reproductive/Obstetrics negative OB ROS                            Anesthesia Physical Anesthesia Plan  ASA: 2  Anesthesia Plan: General   Post-op Pain Management: Minimal or no pain anticipated    Induction: Intravenous  PONV Risk Score and Plan: Propofol infusion and TIVA  Airway Management Planned: Natural Airway  Additional Equipment:   Intra-op Plan:   Post-operative Plan:   Informed Consent: I have reviewed the patients History and Physical, chart, labs and discussed the procedure including the risks, benefits and alternatives for the proposed anesthesia with the patient or authorized representative who has indicated his/her understanding and acceptance.     Dental Advisory Given  Plan Discussed with: Anesthesiologist, CRNA and Surgeon  Anesthesia Plan Comments:        Anesthesia Quick Evaluation

## 2022-07-09 NOTE — Transfer of Care (Signed)
Immediate Anesthesia Transfer of Care Note  Patient: Jordan Duran  Procedure(s) Performed: COLONOSCOPY  Patient Location: Endoscopy Unit  Anesthesia Type:General  Level of Consciousness: drowsy and patient cooperative  Airway & Oxygen Therapy: Patient Spontanous Breathing and Patient connected to face mask oxygen  Post-op Assessment: Report given to RN and Post -op Vital signs reviewed and stable  Post vital signs: Reviewed and stable  Last Vitals:  Vitals Value Taken Time  BP 95/52 07/09/22 0819  Temp    Pulse 84 07/09/22 0822  Resp 18 07/09/22 0822  SpO2 100 % 07/09/22 0822  Vitals shown include unvalidated device data.  Last Pain:  Vitals:   07/09/22 0704  PainSc: 0-No pain         Complications: No notable events documented.

## 2022-07-10 ENCOUNTER — Encounter: Payer: Self-pay | Admitting: Gastroenterology

## 2022-07-10 LAB — SURGICAL PATHOLOGY

## 2022-09-21 ENCOUNTER — Ambulatory Visit
Admission: EM | Admit: 2022-09-21 | Discharge: 2022-09-21 | Disposition: A | Discharge status: Home / Self Care | Payer: Medicare Other | Attending: Physician Assistant | Admitting: Physician Assistant

## 2022-09-21 ENCOUNTER — Encounter: Payer: Self-pay | Admitting: Emergency Medicine

## 2022-09-21 DIAGNOSIS — N3000 Acute cystitis without hematuria: Secondary | ICD-10-CM | POA: Insufficient documentation

## 2022-09-21 LAB — URINALYSIS, ROUTINE W REFLEX MICROSCOPIC
Bilirubin Urine: NEGATIVE
Glucose, UA: NEGATIVE mg/dL
Hgb urine dipstick: NEGATIVE
Ketones, ur: NEGATIVE mg/dL
Nitrite: NEGATIVE
Protein, ur: NEGATIVE mg/dL
Specific Gravity, Urine: 1.02 (ref 1.005–1.030)
pH: 5.5 (ref 5.0–8.0)

## 2022-09-21 LAB — URINALYSIS, MICROSCOPIC (REFLEX): RBC / HPF: NONE SEEN RBC/hpf (ref 0–5)

## 2022-09-21 MED ORDER — NITROFURANTOIN MONOHYD MACRO 100 MG PO CAPS: 100.0000 mg | ORAL_CAPSULE | Freq: Two times a day (BID) | ORAL | 0 refills | Status: AC

## 2022-09-21 NOTE — ED Provider Notes (Signed)
[facility]    CSN: [id number] Arrival date & time: 09/21/22  1349      History   Chief Complaint Chief Complaint  Patient presents with   Dysuria    HPI Jordan Duran is a 66 y.o. female with a history of Parkinson's, recurrent UTI presents to UC today with complaint of urinary urgency, frequency and dysuria.  She reports this started 3 days ago.  She denies pelvic pressure, blood in her urine, vaginal discharge, vaginal odor, vaginal irritation, abnormal vaginal bleeding or low back pain.  She denies fever, chills, nausea or vomiting.  She has not taken anything OTC for symptoms.  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    Past Surgical History:  Procedure Laterality Date   ABDOMINAL HYSTERECTOMY     CERVICAL SPINE SURGERY     CESAREAN SECTION     CHOLECYSTECTOMY     COLONOSCOPY N/A 07/09/2022   Procedure: COLONOSCOPY;  Surgeon: Annamaria Helling, DO;  Location: [facility] ENDOSCOPY;  Service: Gastroenterology;  Laterality: N/A;   COLONOSCOPY WITH PROPOFOL N/A 07/29/2015   Procedure: COLONOSCOPY WITH PROPOFOL;  Surgeon: Hulen Luster, MD;  Location: [facility] ENDOSCOPY;  Service: Gastroenterology;  Laterality: N/A;   TUBAL LIGATION      OB History   No obstetric history on file.      Home Medications    Prior to Admission medications   Medication Sig Start Date End Date Taking? Authorizing  Provider  nitrofurantoin, macrocrystal-monohydrate, (MACROBID) 100 MG capsule Take 1 capsule (100 mg total) by mouth 2 (two) times daily. 09/21/22  Yes Narelle Schoening, Coralie Keens, NP  amantadine (SYMMETREL) 100 MG capsule Take 100 mg by mouth 2 (two) times daily.    [provider]  baclofen (LIORESAL) 10 MG tablet Take 1 tablet (10 mg total) by mouth 3 (three) times daily. 06/15/21   Margarette Canada, NP  calcium carbonate (TUMS - DOSED IN MG ELEMENTAL CALCIUM) 500 MG chewable tablet Chew 1 tablet by mouth 2 (two) times daily.    [provider]  Carbidopa-Levodopa ER (SINEMET CR) 25-100 MG tablet controlled release Take 1.5 tablets by mouth.  09/05/18 04/18/21  [provider]  Cholecalciferol (VITAMIN D-3 PO) Take 1,000 capsules by mouth daily.    [provider]  citalopram (CELEXA) 20 MG tablet Take 20 mg by mouth daily.    [provider]  gabapentin (NEURONTIN) 100 MG capsule Take 300 mg by mouth at bedtime. 01/24/20   [provider]  HYDROcodone-acetaminophen (NORCO) 7.5-325 MG tablet Limit one half to one tab by mouth per day or twice per day if tolerated 06/14/16   Mohammed Kindle, MD  ibuprofen (ADVIL) 600 MG tablet Take 1 tablet (600 mg total) by mouth every 6 (six) hours as needed. 04/18/21  Melynda Ripple, MD  ipratropium (ATROVENT) 0.06 % nasal spray Place 2 sprays into both nostrils 4 (four) times daily. 03/13/21   Margarette Canada, NP  loratadine-pseudoephedrine (CLARITIN-D 24-HOUR) 10-240 MG 24 hr tablet Take 1 tablet by mouth daily.    [provider]  mometasone-formoterol (DULERA) 100-5 MCG/ACT AERO Inhale 2 puffs into the lungs 2 (two) times daily.    [provider]  Multiple Vitamins-Minerals (MULTIVITAMIN ADULT PO) Take by mouth.    [provider]  naloxone (NARCAN) 4 MG/0.1ML LIQD nasal spray kit Narcan 4 mg/actuation nasal spray    [provider]  nortriptyline (PAMELOR) 25 MG capsule Take 25 mg by mouth  at bedtime.    [provider]  tiZANidine (ZANAFLEX) 2 MG tablet Take by mouth at bedtime.    [provider]  tobramycin-dexamethasone [facility]) ophthalmic solution Place 2 drops into both eyes every 6 (six) hours. 03/13/21   Margarette Canada, NP  YUVAFEM 10 MCG TABS vaginal tablet Place 1 tablet vaginally 2 (two) times a week. 03/14/20   [provider]  pramipexole (MIRAPEX) 0.5 MG tablet Take 0.5 mg by mouth 3 (three) times daily.  03/19/20  [provider]    Family History Family History  Problem Relation Age of Onset   Arthritis Mother    Depression Mother    Diabetes Mother    Early death Mother    Varicose Veins Mother    Skin cancer Mother    Obesity Mother    Heart disease Father    Hypertension Father    Hip fracture Father    Ulcers Father    Arthritis Sister    Depression Sister    Drug abuse Sister    Anxiety disorder Sister    Bipolar disorder Sister    Alcohol abuse Brother    Obesity Maternal Grandmother    Hypertension Maternal Grandmother    Stroke Maternal Grandmother    Varicose Veins Maternal Grandmother    Diabetes Paternal Grandmother    Arthritis Paternal Grandmother    Obesity Son    Depression Son    Diabetes Maternal Aunt    Depression Maternal Aunt    Diabetes Daughter    Depression Daughter    Obesity Daughter     Social History Social History   Tobacco Use   Smoking status: Former   Smokeless tobacco: Former    Quit date: 07/10/1974  Vaping Use   Vaping Use: Never used  Substance Use Topics   Alcohol use: No    Alcohol/week: 0.0 standard drinks of alcohol   Drug use: Yes    Types: Marijuana     Allergies   Sulfa antibiotics and Demerol [meperidine]   Review of Systems Review of Systems   Constitutional: Denies fever, malaise, fatigue, headache or abrupt weight changes.  Respiratory: Denies difficulty breathing, shortness of breath, cough or sputum production.   Cardiovascular: Denies  chest pain, chest tightness, palpitations or swelling in the hands or feet.  Gastrointestinal: Denies abdominal pain, bloating, constipation, diarrhea or blood in the stool.  GU: Patient reports urinary urgency, frequency and dysuria.  Denies burning sensation, blood in urine, odor or discharge.  No other specific complaints in a complete review of systems (except as listed in HPI above).  Physical Exam Triage Vital Signs ED Triage Vitals  Enc Vitals Group     BP 09/21/22 1430 (!) 162/118     Pulse Rate 09/21/22 1430 (!) 109     Resp 09/21/22 1430 14  Temp 09/21/22 1430 98.9 F (37.2 C)     Temp Source 09/21/22 1430 Oral     SpO2 09/21/22 1430 96 %     Weight 09/21/22 1429 153 lb (69.4 kg)     Height 09/21/22 1429 5' (1.524 m)     Head Circumference --      Peak Flow --      Pain Score 09/21/22 1429 4     Pain Loc --      Pain Edu? --      Excl. in [place]? --    No data found.  Updated Vital Signs BP (!) 162/118 (BP Location: Left Arm)   Pulse (!) 109   Temp 98.9 F (37.2 C) (Oral)   Resp 14   Ht 5' (1.524 m)   Wt 153 lb (69.4 kg)   SpO2 96%   BMI 29.88 kg/m      Physical Exam BP (!) 162/118 (BP Location: Left Arm)   Pulse (!) 109   Temp 98.9 F (37.2 C) (Oral)   Resp 14   Ht 5' (1.524 m)   Wt 153 lb (69.4 kg)   SpO2 96%   BMI 29.88 kg/m  Wt Readings from Last 3 Encounters:  09/21/22 153 lb (69.4 kg)  07/09/22 148 lb (67.1 kg)  06/15/21 149 lb (67.6 kg)    General: Appears her stated age, overweight, in NAD.  Cardiovascular: Tachycardic with normal rhythm. Faint murmur noted. Pulmonary/Chest: Normal effort and positive vesicular breath sounds. No respiratory distress. No wheezes, rales or ronchi noted.  Abdomen: Soft and nontender. Normal bowel sounds.  No CVA tenderness noted. Neurological: Alert and oriented.  Resting tremor noted.    UC Treatments / Results  Labs  Labs Reviewed  URINALYSIS, ROUTINE W REFLEX MICROSCOPIC - Abnormal; Notable for  the following components:      Result Value   Leukocytes,Ua SMALL (*)    All other components within normal limits  URINALYSIS, MICROSCOPIC (REFLEX) - Abnormal; Notable for the following components:   Bacteria, UA MANY (*)    All other components within normal limits  URINE CULTURE     Medications Ordered in UC Meds ordered this encounter  Medications   nitrofurantoin, macrocrystal-monohydrate, (MACROBID) 100 MG capsule    Sig: Take 1 capsule (100 mg total) by mouth 2 (two) times daily.    Dispense:  10 capsule    Refill:  0    Order Specific Question:   Supervising Provider    Answer:   Chase Picket [id number]     Initial Impression / Assessment and Plan / UC Course  I have reviewed the triage vital signs and the nursing notes.  Pertinent labs & imaging results that were available during my care of the patient were reviewed by me and considered in my medical decision making (see chart for details).     66 year old female presents to UC today with complaint of urinary urgency, frequency and dysuria x3 days.  She has a history of recurrent UTIs.  Urinalysis shows small leukocytes and many bacteria. Will treat with Macrobid 100 mg twice daily x5 days.. Encouraged her to push fluids. Advised her to follow up with her PCP if symptoms persist or worsen.  Final Clinical Impressions(s) / UC Diagnoses   Final diagnoses:  Acute cystitis without hematuria     Discharge Instructions      You were seen today for urinary symptoms.  We are treating you with a UTI with antibiotics twice daily for  the next 5 days.  Increase your water intake.  Follow-up with your PCP if symptoms persist or worsen.     ED Prescriptions     Medication Sig Dispense Auth. Provider   nitrofurantoin, macrocrystal-monohydrate, (MACROBID) 100 MG capsule Take 1 capsule (100 mg total) by mouth 2 (two) times daily. 10 capsule Jearld Fenton, NP      PDMP not reviewed this encounter.   Jearld Fenton, NP 09/21/22 1507

## 2022-09-21 NOTE — ED Triage Notes (Signed)
Patient c/o dysuria and urinary urgency and frequency that started 3 days ago.

## 2022-09-21 NOTE — Discharge Instructions (Signed)
You were seen today for urinary symptoms.  We are treating you with a UTI with antibiotics twice daily for the next 5 days.  Increase your water intake.  Follow-up with your PCP if symptoms persist or worsen.

## 2022-09-23 LAB — URINE CULTURE: Culture: <10000 — AB

## 2023-10-14 ENCOUNTER — Ambulatory Visit
Admission: EM | Admit: 2023-10-14 | Discharge: 2023-10-14 | Disposition: A | Discharge status: Home / Self Care | Payer: Medicare Other | Attending: Family Medicine | Admitting: Family Medicine

## 2023-10-14 ENCOUNTER — Encounter: Payer: Self-pay | Admitting: Emergency Medicine

## 2023-10-14 DIAGNOSIS — J40 Bronchitis, not specified as acute or chronic: Secondary | ICD-10-CM

## 2023-10-14 MED ORDER — CEFDINIR 300 MG PO CAPS: 300.0000 mg | ORAL_CAPSULE | Freq: Two times a day (BID) | ORAL | 0 refills | Status: AC

## 2023-10-14 MED ORDER — PROMETHAZINE-DM 6.25-15 MG/5ML PO SYRP: 5.0000 mL | ORAL_SOLUTION | Freq: Four times a day (QID) | ORAL | 0 refills | Status: AC | PRN

## 2023-10-14 NOTE — Discharge Instructions (Signed)
Follow-up in 7 days if no improvement noted and will obtain a chest x-ray.  Follow-up sooner if symptoms worsen.

## 2023-10-14 NOTE — ED Triage Notes (Signed)
Pt presents with a cough, headache, nasal drainage x 1 week. Pt has only taken OTC pain medication for her symptoms.

## 2023-10-14 NOTE — ED Provider Notes (Signed)
[facility]    CSN: [id number] Arrival date & time: 10/14/23  1231      History   Chief Complaint Chief Complaint  Patient presents with   Headache   Cough    HPI Jordan Duran is a 67 y.o. female.   Patient was brought in by daughter.  Complaint of cough congestion and fatigue associated with body ache due to coughing.  No fever.  No shortness of breath.  No lightheadedness dizziness chest pain.  Symptoms started 1 week ago.  Patient's husband has similar symptoms 2.  Patient has Parkinson's disease.  Due to her coughing so much and feeling tired it flared up her Parkinson and now she cannot walk.  Already on optimal pain control.  Mild sore throat.  Patient's 58-year-old grandchild has similar symptoms who started all this.  He is better now.   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   Greater trochanteric bursitis of both hips 03/10/2015   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    Past Surgical History:  Procedure Laterality Date   ABDOMINAL HYSTERECTOMY     CERVICAL SPINE SURGERY     CESAREAN SECTION     CHOLECYSTECTOMY     COLONOSCOPY N/A 07/09/2022   Procedure: COLONOSCOPY;  Surgeon: Jaynie Collins, DO;  Location: [facility] ENDOSCOPY;  Service: Gastroenterology;  Laterality: N/A;   COLONOSCOPY WITH PROPOFOL N/A 07/29/2015   Procedure: COLONOSCOPY WITH PROPOFOL;  Surgeon: Wallace Cullens, MD;  Location:  [facility] ENDOSCOPY;  Service: Gastroenterology;  Laterality: N/A;   TUBAL LIGATION      OB History   No obstetric history on file.      Home Medications    Prior to Admission medications   Medication Sig Start Date End Date Taking? Authorizing Provider  Carbidopa-Levodopa ER (SINEMET CR) 25-100 MG tablet controlled release Take 1.5 tablets by mouth.  09/05/18 10/14/23 Yes [provider]  cefdinir (OMNICEF) 300 MG capsule Take 1 capsule (300 mg total) by mouth 2 (two) times daily for 10 days. 10/14/23 10/24/23 Yes Lura Em, MD  Cholecalciferol (VITAMIN D-3 PO) Take 1,000 capsules by mouth daily.   Yes [provider]  citalopram (CELEXA) 20 MG tablet Take 20 mg by mouth daily.   Yes [provider]  gabapentin (NEURONTIN) 100 MG capsule Take 300 mg by mouth at bedtime. 01/24/20  Yes [provider]  HYDROcodone-acetaminophen (NORCO) 7.5-325 MG tablet Limit one half to one tab by mouth per day or twice per day if tolerated 06/14/16  Yes Ewing Schlein, MD  promethazine-dextromethorphan (PROMETHAZINE-DM) 6.25-15 MG/5ML syrup Take 5 mLs by mouth 4 (four) times daily as needed for cough. 10/14/23  Yes Lura Em, MD  tiZANidine (ZANAFLEX) 2 MG tablet Take by mouth at bedtime.   Yes [provider]  YUVAFEM 10 MCG TABS vaginal tablet Place 1 tablet vaginally 2 (two) times a week. 03/14/20  Yes [provider]  baclofen (LIORESAL) 10 MG tablet Take 1 tablet (10 mg total) by mouth 3 (three) times daily. 06/15/21   Becky Augusta, NP  busPIRone (BUSPAR) 5 MG tablet Take 10 mg by mouth 3 (three) times daily.    [provider]  calcium carbonate (TUMS - DOSED IN MG ELEMENTAL CALCIUM) 500 MG chewable tablet Chew 1 tablet by mouth 2 (two) times daily.    [provider]  ibuprofen (ADVIL) 600 MG tablet Take 1 tablet (600 mg total) by mouth every 6 (six) hours as needed. 04/18/21   Domenick Gong, MD  ipratropium  (ATROVENT) 0.06 % nasal spray Place 2 sprays into both nostrils 4 (four) times daily. 03/13/21   Becky Augusta, NP  loratadine-pseudoephedrine (CLARITIN-D 24-HOUR) 10-240 MG 24 hr tablet Take 1 tablet by mouth daily.    [provider]  mometasone-formoterol (DULERA) 100-5 MCG/ACT AERO Inhale 2 puffs into the lungs 2 (two) times daily.    [provider]  Multiple Vitamins-Minerals (MULTIVITAMIN ADULT PO) Take by mouth.    [provider]  naloxone (NARCAN) 4 MG/0.1ML LIQD nasal spray kit Narcan 4 mg/actuation nasal spray    [provider]  nitrofurantoin, macrocrystal-monohydrate, (MACROBID) 100 MG capsule Take 1 capsule (100 mg total) by mouth 2 (two) times daily. 09/21/22   Lorre Munroe, NP  nortriptyline (PAMELOR) 25 MG capsule Take 25 mg by mouth at bedtime.    [provider]  rOPINIRole (REQUIP) 1 MG tablet Take 1 mg by mouth 3 (three) times daily.    [provider]  tobramycin-dexamethasone Wallene Dales) ophthalmic solution Place 2 drops into both eyes every 6 (six) hours. 03/13/21   Becky Augusta, NP  pramipexole (MIRAPEX) 0.5 MG tablet Take 0.5 mg by mouth 3 (three) times daily.  03/19/20  [provider]    Family History Family History  Problem Relation Age of Onset   Arthritis Mother    Depression Mother    Diabetes Mother    Early death Mother    Varicose Veins Mother    Skin cancer Mother    Obesity Mother    Heart disease Father    Hypertension Father    Hip fracture Father    Ulcers Father    Arthritis Sister    Depression Sister    Drug abuse Sister    Anxiety disorder Sister    Bipolar disorder Sister    Alcohol abuse Brother    Obesity Maternal Grandmother    Hypertension Maternal Grandmother    Stroke Maternal Grandmother    Varicose Veins Maternal Grandmother    Diabetes Paternal Grandmother    Arthritis Paternal Grandmother    Obesity Son    Depression Son    Diabetes Maternal Aunt     Depression Maternal Aunt    Diabetes Daughter    Depression Daughter    Obesity Daughter     Social History Social History   Tobacco Use   Smoking status: Former   Smokeless tobacco: Former    Quit date: 07/10/1974  Vaping Use   Vaping status: Never Used  Substance Use Topics   Alcohol use: No    Alcohol/week: 0.0 standard drinks of alcohol   Drug use: Yes    Types: Marijuana     Allergies   Sulfa antibiotics and Demerol [meperidine]   Review of Systems Negative other than mentioned in HPI.  Physical Exam Triage Vital Signs ED Triage Vitals  Encounter Vitals Group     BP 10/14/23 1326 122/68     Systolic BP Percentile --      Diastolic BP Percentile --      Pulse Rate 10/14/23 1326 91     Resp 10/14/23 1326 18     Temp 10/14/23 1326 98.6 F (37 C)     Temp Source 10/14/23 1326 Oral     SpO2 10/14/23 1326 92 %     Weight --      Height --      Head Circumference --      Peak Flow --      Pain Score 10/14/23 1323 0     Pain Loc --      Pain Education --      Exclude from Growth Chart --    No data found.  Updated Vital Signs BP 122/68 (BP Location: Left Arm)   Pulse 91   Temp 98.6 F (37 C) (Oral)   Resp 18   SpO2 92%   Visual Acuity Right Eye Distance:   Left Eye Distance:   Bilateral Distance:    Right Eye Near:   Left Eye Near:    Bilateral Near:     Physical Exam Vitals reviewed.  Constitutional:      Appearance: She is well-developed.  HENT:     Head: Normocephalic and atraumatic.     Mouth/Throat:     Mouth: Mucous membranes are moist.  Eyes:     Extraocular Movements: Extraocular movements intact.  Cardiovascular:     Rate and Rhythm: Normal rate and regular rhythm.  Pulmonary:     Effort: Pulmonary effort is normal. No respiratory distress.     Breath sounds: No stridor. No wheezing, rhonchi or rales.  Chest:     Chest wall: No tenderness.  Musculoskeletal:     Cervical back: Normal range of motion.  Skin:    General:  Skin is warm.  Neurological:     Mental Status: She is alert. Mental status is at baseline.  Psychiatric:        Mood and Affect: Mood normal.        Speech: Speech normal.        Behavior: Behavior normal.      UC Treatments / Results  Labs (all labs ordered are listed, but only abnormal results are displayed) Labs Reviewed - No data to display  EKG   Radiology No results found.  Procedures Procedures (including critical care time)  Medications Ordered in UC Medications - No data to display  Initial Impression / Assessment and Plan / UC Course  I have reviewed the triage vital signs and the nursing notes.  Pertinent labs & imaging results that were available during my care of the patient were reviewed by me and considered in my medical decision making (see chart for details).      Final Clinical Impressions(s) / UC Diagnoses   Final diagnoses:  Bronchitis     Discharge Instructions      Follow-up in 7 days if no improvement noted and will obtain a chest x-ray.  Follow-up sooner if symptoms worsen.     ED Prescriptions     Medication Sig Dispense Auth. Provider   cefdinir (OMNICEF) 300 MG capsule Take 1 capsule (300 mg total) by mouth 2 (two) times daily for 10 days. 20 capsule Lura Em, MD   promethazine-dextromethorphan (PROMETHAZINE-DM) 6.25-15 MG/5ML syrup Take 5 mLs  by mouth 4 (four) times daily as needed for cough. 118 mL Lura Em, MD      PDMP not reviewed this encounter.   Lura Em, MD 10/14/23 1351
